# Patient Record
Sex: Female | Born: 1945 | Race: White | Hispanic: No | Marital: Married | Smoking: Former smoker
Health system: Southern US, Community
[De-identification: ages and names within clinical notes are randomized; demographics above are authoritative.]

## PROBLEM LIST (undated history)

## (undated) DIAGNOSIS — M810 Age-related osteoporosis without current pathological fracture: Secondary | ICD-10-CM

## (undated) DIAGNOSIS — M1990 Unspecified osteoarthritis, unspecified site: Secondary | ICD-10-CM

## (undated) HISTORY — DX: Age-related osteoporosis without current pathological fracture: M81.0

## (undated) HISTORY — DX: Unspecified osteoarthritis, unspecified site: M19.90

## (undated) HISTORY — PX: KNEE ARTHROSCOPY: SUR90

---

## 1998-12-30 ENCOUNTER — Ambulatory Visit (HOSPITAL_COMMUNITY): Admission: RE | Admit: 1998-12-30 | Discharge: 1998-12-30 | Payer: Self-pay | Admitting: Specialist

## 1998-12-30 ENCOUNTER — Encounter: Payer: Self-pay | Admitting: Specialist

## 1999-05-07 ENCOUNTER — Encounter: Admission: RE | Admit: 1999-05-07 | Discharge: 1999-05-07 | Payer: Self-pay | Admitting: Rheumatology

## 1999-05-07 ENCOUNTER — Encounter: Payer: Self-pay | Admitting: Rheumatology

## 2001-07-07 ENCOUNTER — Encounter: Admission: RE | Admit: 2001-07-07 | Discharge: 2001-07-07 | Payer: Self-pay | Admitting: Rheumatology

## 2001-07-07 ENCOUNTER — Encounter: Payer: Self-pay | Admitting: Rheumatology

## 2003-07-10 ENCOUNTER — Encounter: Admission: RE | Admit: 2003-07-10 | Discharge: 2003-07-10 | Payer: Self-pay | Admitting: Rheumatology

## 2004-09-18 ENCOUNTER — Other Ambulatory Visit: Admission: RE | Admit: 2004-09-18 | Discharge: 2004-09-18 | Payer: Self-pay | Admitting: Internal Medicine

## 2005-07-14 ENCOUNTER — Ambulatory Visit (HOSPITAL_COMMUNITY): Admission: RE | Admit: 2005-07-14 | Discharge: 2005-07-14 | Payer: Self-pay | Admitting: Internal Medicine

## 2005-09-25 ENCOUNTER — Encounter: Admission: RE | Admit: 2005-09-25 | Discharge: 2005-09-25 | Payer: Self-pay | Admitting: Internal Medicine

## 2006-02-15 ENCOUNTER — Other Ambulatory Visit: Admission: RE | Admit: 2006-02-15 | Discharge: 2006-02-15 | Payer: Self-pay | Admitting: Internal Medicine

## 2007-09-30 ENCOUNTER — Encounter: Admission: RE | Admit: 2007-09-30 | Discharge: 2007-09-30 | Payer: Self-pay | Admitting: Internal Medicine

## 2007-10-04 ENCOUNTER — Other Ambulatory Visit: Admission: RE | Admit: 2007-10-04 | Discharge: 2007-10-04 | Payer: Self-pay | Admitting: Internal Medicine

## 2008-05-25 ENCOUNTER — Ambulatory Visit: Payer: Self-pay | Admitting: Internal Medicine

## 2008-05-30 ENCOUNTER — Encounter: Admission: RE | Admit: 2008-05-30 | Discharge: 2008-05-30 | Payer: Self-pay | Admitting: Internal Medicine

## 2009-09-13 ENCOUNTER — Ambulatory Visit: Payer: Self-pay | Admitting: Internal Medicine

## 2009-11-15 ENCOUNTER — Other Ambulatory Visit: Admission: RE | Admit: 2009-11-15 | Discharge: 2009-11-15 | Payer: Self-pay | Admitting: Internal Medicine

## 2009-11-29 ENCOUNTER — Encounter: Admission: RE | Admit: 2009-11-29 | Discharge: 2009-11-29 | Payer: Self-pay | Admitting: Internal Medicine

## 2011-09-24 ENCOUNTER — Other Ambulatory Visit: Payer: Medicare Other | Admitting: Internal Medicine

## 2011-09-24 DIAGNOSIS — Z0000 Encounter for general adult medical examination without abnormal findings: Secondary | ICD-10-CM

## 2011-09-24 LAB — CBC WITH DIFFERENTIAL/PLATELET
Basophils Relative: 0 % (ref 0–1)
Eosinophils Absolute: 0.1 10*3/uL (ref 0.0–0.7)
HCT: 42.2 % (ref 36.0–46.0)
Lymphocytes Relative: 33 % (ref 12–46)
Lymphs Abs: 2.1 10*3/uL (ref 0.7–4.0)
MCH: 27.5 pg (ref 26.0–34.0)
MCHC: 33.6 g/dL (ref 30.0–36.0)
MCV: 81.6 fL (ref 78.0–100.0)
Monocytes Relative: 7 % (ref 3–12)
Neutro Abs: 3.7 10*3/uL (ref 1.7–7.7)
RDW: 13.9 % (ref 11.5–15.5)
WBC: 6.5 10*3/uL (ref 4.0–10.5)

## 2011-09-24 LAB — LIPID PANEL
Cholesterol: 309 mg/dL — ABNORMAL HIGH (ref 0–200)
HDL: 64 mg/dL (ref >39)
Total CHOL/HDL Ratio: 4.8 Ratio

## 2011-09-24 LAB — TSH: TSH: 2.464 u[IU]/mL (ref 0.350–4.500)

## 2011-09-24 LAB — COMPREHENSIVE METABOLIC PANEL
ALT: 24 U/L (ref 0–35)
Alkaline Phosphatase: 98 U/L (ref 39–117)
BUN: 14 mg/dL (ref 6–23)
Calcium: 9.8 mg/dL (ref 8.4–10.5)
Sodium: 139 mEq/L (ref 135–145)
Total Protein: 7.5 g/dL (ref 6.0–8.3)

## 2011-09-25 ENCOUNTER — Ambulatory Visit (INDEPENDENT_AMBULATORY_CARE_PROVIDER_SITE_OTHER): Payer: Medicare Other | Admitting: Internal Medicine

## 2011-09-25 ENCOUNTER — Encounter: Payer: Self-pay | Admitting: Internal Medicine

## 2011-09-25 VITALS — BP 120/68 | HR 72 | Temp 98.1°F | Ht 68.0 in | Wt 156.0 lb

## 2011-09-25 DIAGNOSIS — Z1211 Encounter for screening for malignant neoplasm of colon: Secondary | ICD-10-CM

## 2011-09-25 DIAGNOSIS — Z0000 Encounter for general adult medical examination without abnormal findings: Secondary | ICD-10-CM

## 2011-09-25 DIAGNOSIS — M25562 Pain in left knee: Secondary | ICD-10-CM

## 2011-09-25 DIAGNOSIS — M810 Age-related osteoporosis without current pathological fracture: Secondary | ICD-10-CM

## 2011-09-25 DIAGNOSIS — Z23 Encounter for immunization: Secondary | ICD-10-CM

## 2011-09-25 DIAGNOSIS — M25569 Pain in unspecified knee: Secondary | ICD-10-CM

## 2011-09-25 DIAGNOSIS — E785 Hyperlipidemia, unspecified: Secondary | ICD-10-CM

## 2011-09-25 LAB — POCT URINALYSIS DIPSTICK
Glucose, UA: NEGATIVE
Ketones, UA: NEGATIVE
Nitrite, UA: NEGATIVE
Spec Grav, UA: 1.015
Urobilinogen, UA: NEGATIVE

## 2011-09-25 NOTE — Progress Notes (Signed)
Subjective:    Patient ID: Samantha Byrd, female    DOB: Jul 23, 1945, 66 y.o.   MRN: [id number]  HPI Subjective:   Patient presents for Welcome to Medicare physical exam. History of hyperlipidemia currently refuses medication or nutritional counseling. History of osteoporosis. History of right patellar dislocation. Has history of vaginal atrophy. Is interested in Premarin vaginal cream since she is sexually active. She took Fosamax for several years beginning in 2001. Discontinued at my suggestion and 2011. Last bone density study 2007. Had sigmoidoscopy by Dr. Matthias Hughs in 1998 but has refused colonoscopy. Refuses mammogram. Did ask her to do 3 Hemoccult cards. No known drug allergies. Last mammogram on file 2011. She saw an endocrinologist in 2007 regarding what to do about her osteoporosis. He recommend changing from Fosamax to Reclast if bone density study in the future should further decline in bone density. However she has refused to do this. Has seen Dr. Baldo Ash in [place] for treatment (holistic) of knee pain. Sees him every few months and thinks that has helped her knee pain. Doesn't have knee pain at the present time.  History of right knee arthroscopic surgery for torn cartilage 1987, history of arthroscopic knee surgery left knee for torn cartilage 2002.  Patient has never been hospitalized except for childbirth times one.  Married. Has a Masters degree. Self-employed bookkeeper. Does not smoke or consume alcohol.  Review Past Medical/Family/Social:   Risk Factors  Current exercise habits:  Dietary issues discussed:   Cardiac risk factors: Obesity (BMI >= 30 kg/m2).   Depression Screen  (Note: if answer to either of the following is "Yes", a more complete depression screening is indicated)  Over the past two weeks, have you felt down, depressed or hopeless? no Over the past two weeks, have you felt little interest or pleasure in doing things? no Have you lost  interest or pleasure in daily life? no  Do you often feel hopeless? no Do you cry easily over simple problems? No   Activities of Daily Living  In your present state of health, do you have any difficulty performing the following activities?:  Driving? No  Managing money? No  Feeding yourself? No  Getting from bed to chair? No  Climbing a flight of stairs? No  Preparing food and eating?: No  Bathing or showering? No  Getting dressed: No  Getting to the toilet? No  Using the toilet:No  Moving around from place to place: No  In the past year have you fallen or had a near fall?:No  Are you sexually active? yes Do you have more than one partner? No   Hearing Difficulties: No  Do you often ask people to speak up or repeat themselves? No  Do you experience ringing or noises in your ears? no  Do you have difficulty understanding soft or whispered voices? No  Do you feel that you have a problem with memory? no Do you often misplace items? No  Do you feel safe at home? Yes   Cognitive Testing  Alert? Yes Normal Appearance?Yes  Oriented to person? Yes Place? Yes  Time? Yes  Recall of three objects? Yes  Can perform simple calculations? Yes  Displays appropriate judgment?Yes  Can read the correct time from a watch face?Yes   List the Names of Other Physician/Practitioners you currently use: Dr. Woodfin Ganja   Indicate any recent Medical Services you may have received from other than [facility] providers in the past year (date may be approximate).   Screening  Tests / Date Colonoscopy  declined                   Zostavax declined Mammogram declined Influenza Vaccine declined Tetanus/tdap 09/25/11   Objective:   Vision by Snellen chart:  Body mass index:  BP 120/68     Pulse 72 regular    Ht 5 feet 8 inches      Wt  156 pounds     BMI 24     SpO2     General appearance: Appears stated age    Head: Normocephalic, without obvious abnormality, atraumatic  Eyes: conj clear,  EOMi PEERLA  Ears: normal TM's and external ear canals both ears  Nose: Nares normal. Septum midline. Mucosa normal. No drainage or sinus tenderness.  Throat: lips, mucosa, and tongue normal; teeth and gums normal  Neck: no adenopathy, no carotid bruit, no JVD, supple, symmetrical, trachea midline and thyroid not enlarged, symmetric, no tenderness/mass/nodules  No CVA tenderness.  Lungs: clear to auscultation bilaterally  Breasts: normal appearance, no masses or tenderness, top of the pacemaker on left upper chest. Incision well-healed. It is tender.  Heart: regular rate and rhythm, S1, S2 normal, no murmur, click, rub or gallop  Abdomen: soft, non-tender; bowel sounds normal; no masses, no organomegaly  Musculoskeletal: ROM normal in all joints, no crepitus, no deformity, Normal muscle strengthen. Back  is symmetric, no curvature. Skin: Skin color, texture, turgor normal. No rashes or lesions  Lymph nodes: Cervical, supraclavicular, and axillary nodes normal.  Neurologic: CN 2 -12 Normal, Normal symmetric reflexes. Normal coordination and gait  Psych: Alert & Oriented x 3, Mood appear stable.    Assessment:    Welcome to Medicare exam  Hyperlipidemia-refuses medication  Health maintenance-refuses mammogram, colonoscopy and preventive immunizations  History of knee pain treated by Dr. Woodfin Ganja  with holistic therapy  Plan:    During the course of the visit the patient was educated and counseled about appropriate screening and preventive services including:  Screening mammography  Colorectal cancer screening  Shingles vaccine. Prescription given to that she can get the vaccine at the pharmacy or Medicare part D.  Screen + for depression. Negative.   Diet review for nutrition referral? Refused by patient Yes ____ Not Indicated __x__  Patient Instructions (the written plan) was given to the patient.  Medicare Attestation  I have personally reviewed:  The patient's medical and  social history  Their use of alcohol, tobacco or illicit drugs  Their current medications and supplements  The patient's functional ability including ADLs,fall risks, home safety risks, cognitive, and hearing and visual impairment  Diet and physical activities  Evidence for depression or mood disorders  The patient's weight, height, BMI, and visual acuity have been recorded in the chart. I have made referrals, counseling, and provided education to the patient based on review of the above and I have provided the patient with a written personalized care plan for preventive services.         Review of Systems     Objective:   Physical Exam        Assessment & Plan:

## 2011-09-27 ENCOUNTER — Encounter: Payer: Self-pay | Admitting: Internal Medicine

## 2011-09-27 DIAGNOSIS — M810 Age-related osteoporosis without current pathological fracture: Secondary | ICD-10-CM | POA: Insufficient documentation

## 2011-09-27 DIAGNOSIS — E785 Hyperlipidemia, unspecified: Secondary | ICD-10-CM | POA: Insufficient documentation

## 2011-09-27 NOTE — Patient Instructions (Addendum)
Patient refuses lipid-lowering medication. Return in one year or as needed. Refuses colonoscopy and other preventative immunizations

## 2011-10-12 ENCOUNTER — Telehealth: Payer: Self-pay

## 2011-10-12 DIAGNOSIS — Z1211 Encounter for screening for malignant neoplasm of colon: Secondary | ICD-10-CM

## 2011-10-12 NOTE — Addendum Note (Signed)
Addended by: Judy Pimple on: 10/12/2011 04:59 PM   Modules accepted: Orders

## 2011-10-13 NOTE — Telephone Encounter (Signed)
Opened in error

## 2012-04-08 NOTE — Addendum Note (Signed)
Addended by: Judy Pimple on: 04/08/2012 12:37 PM   Modules accepted: Orders

## 2013-12-14 ENCOUNTER — Other Ambulatory Visit: Payer: Medicare Other | Admitting: Internal Medicine

## 2013-12-14 DIAGNOSIS — Z131 Encounter for screening for diabetes mellitus: Secondary | ICD-10-CM

## 2013-12-14 DIAGNOSIS — Z13228 Encounter for screening for other metabolic disorders: Secondary | ICD-10-CM

## 2013-12-14 DIAGNOSIS — M6749 Ganglion, multiple sites: Secondary | ICD-10-CM

## 2013-12-14 DIAGNOSIS — Z130 Encounter for screening for diseases of the blood and blood-forming organs and certain disorders involving the immune mechanism: Secondary | ICD-10-CM

## 2013-12-14 DIAGNOSIS — M810 Age-related osteoporosis without current pathological fracture: Secondary | ICD-10-CM

## 2013-12-14 DIAGNOSIS — Z1329 Encounter for screening for other suspected endocrine disorder: Secondary | ICD-10-CM

## 2013-12-14 DIAGNOSIS — Z13220 Encounter for screening for lipoid disorders: Secondary | ICD-10-CM

## 2013-12-14 LAB — CBC WITH DIFFERENTIAL/PLATELET
BASOS ABS: 0 10*3/uL (ref 0.0–0.1)
BASOS PCT: 0 % (ref 0–1)
EOS PCT: 3 % (ref 0–5)
Eosinophils Absolute: 0.2 10*3/uL (ref 0.0–0.7)
HCT: 40.9 % (ref 36.0–46.0)
Hemoglobin: 14 g/dL (ref 12.0–15.0)
Lymphocytes Relative: 38 % (ref 12–46)
Lymphs Abs: 2 10*3/uL (ref 0.7–4.0)
MCH: 28.7 pg (ref 26.0–34.0)
MCHC: 34.2 g/dL (ref 30.0–36.0)
MCV: 84 fL (ref 78.0–100.0)
Monocytes Absolute: 0.4 10*3/uL (ref 0.1–1.0)
Monocytes Relative: 8 % (ref 3–12)
NEUTROS ABS: 2.7 10*3/uL (ref 1.7–7.7)
NEUTROS PCT: 51 % (ref 43–77)
PLATELETS: 301 10*3/uL (ref 150–400)
RBC: 4.87 MIL/uL (ref 3.87–5.11)
RDW: 13.9 % (ref 11.5–15.5)
WBC: 5.3 10*3/uL (ref 4.0–10.5)

## 2013-12-14 LAB — LIPID PANEL
Cholesterol: 267 mg/dL — ABNORMAL HIGH (ref 0–200)
HDL: 66 mg/dL (ref >39)
LDL Cholesterol: 182 mg/dL — ABNORMAL HIGH (ref 0–99)
TRIGLYCERIDES: 96 mg/dL (ref <150)
Total CHOL/HDL Ratio: 4 Ratio
VLDL: 19 mg/dL (ref 0–40)

## 2013-12-14 LAB — COMPREHENSIVE METABOLIC PANEL
ALBUMIN: 4.5 g/dL (ref 3.5–5.2)
ALT: 20 U/L (ref 0–35)
AST: 25 U/L (ref 0–37)
Alkaline Phosphatase: 103 U/L (ref 39–117)
BILIRUBIN TOTAL: 0.6 mg/dL (ref 0.2–1.2)
BUN: 17 mg/dL (ref 6–23)
CO2: 29 mEq/L (ref 19–32)
CREATININE: 0.77 mg/dL (ref 0.50–1.10)
Calcium: 9.8 mg/dL (ref 8.4–10.5)
Chloride: 100 mEq/L (ref 96–112)
GLUCOSE: 84 mg/dL (ref 70–99)
Potassium: 4.4 mEq/L (ref 3.5–5.3)
SODIUM: 139 meq/L (ref 135–145)
TOTAL PROTEIN: 7.3 g/dL (ref 6.0–8.3)

## 2013-12-14 LAB — TSH: TSH: 2.883 u[IU]/mL (ref 0.350–4.500)

## 2013-12-15 ENCOUNTER — Other Ambulatory Visit (HOSPITAL_COMMUNITY)
Admission: RE | Admit: 2013-12-15 | Discharge: 2013-12-15 | Disposition: A | Discharge status: Home / Self Care | Payer: Medicare Other | Source: Ambulatory Visit | Attending: Internal Medicine | Admitting: Internal Medicine

## 2013-12-15 ENCOUNTER — Ambulatory Visit (INDEPENDENT_AMBULATORY_CARE_PROVIDER_SITE_OTHER): Payer: Medicare Other | Admitting: Internal Medicine

## 2013-12-15 ENCOUNTER — Encounter: Payer: Self-pay | Admitting: Internal Medicine

## 2013-12-15 VITALS — BP 118/72 | HR 62 | Ht 67.25 in | Wt 138.0 lb

## 2013-12-15 DIAGNOSIS — M810 Age-related osteoporosis without current pathological fracture: Secondary | ICD-10-CM

## 2013-12-15 DIAGNOSIS — E785 Hyperlipidemia, unspecified: Secondary | ICD-10-CM

## 2013-12-15 DIAGNOSIS — Z124 Encounter for screening for malignant neoplasm of cervix: Secondary | ICD-10-CM | POA: Insufficient documentation

## 2013-12-15 DIAGNOSIS — Z0000 Encounter for general adult medical examination without abnormal findings: Secondary | ICD-10-CM

## 2013-12-15 DIAGNOSIS — M170 Bilateral primary osteoarthritis of knee: Secondary | ICD-10-CM

## 2013-12-15 DIAGNOSIS — Z780 Asymptomatic menopausal state: Secondary | ICD-10-CM

## 2013-12-15 DIAGNOSIS — M1710 Unilateral primary osteoarthritis, unspecified knee: Secondary | ICD-10-CM

## 2013-12-15 LAB — POCT URINALYSIS DIPSTICK
Bilirubin, UA: NEGATIVE
Blood, UA: NEGATIVE
Glucose, UA: NEGATIVE
Ketones, UA: NEGATIVE
Leukocytes, UA: NEGATIVE
Nitrite, UA: NEGATIVE
Protein, UA: NEGATIVE
Spec Grav, UA: 1.015
Urobilinogen, UA: NEGATIVE
pH, UA: 6.5

## 2013-12-15 LAB — VITAMIN D 25 HYDROXY (VIT D DEFICIENCY, FRACTURES): Vit D, 25-Hydroxy: 67 ng/mL (ref 30–89)

## 2013-12-15 MED ORDER — ESTROGENS, CONJUGATED 0.625 MG/GM VA CREA: 1.0000 | TOPICAL_CREAM | VAGINAL | Status: DC

## 2013-12-15 NOTE — Progress Notes (Signed)
Subjective:    Patient ID: Samantha Byrd, female    DOB: January 20, 1946, 68 y.o.   MRN: [id number]  HPI 68 year old White Female in today for health maintenance exam and evaluation of medical issues including osteoarthritis of both knees and hyperlipidemia which is diet controlled. Refuses statin therapy. Refuses pneumonia vaccine and flu vaccine. Refuses Zostavax vaccine.  History of osteoporosis, right patellar dislocation, vaginal atrophy. She took Fosamax for several years beginning in 2001 and discontinued in 2011. Last bone density study 2007. Had sigmoidoscopy by Dr. Si Gaul knee in 1998 but has refused colonoscopy. Refuses mammogram. Did ask her to return in 3 Hemoccult cards.  No known drug allergies  Last mammogram on file 2011.  She saw an endocrinologist in 2007 regarding treatment of osteoporosis. He recommended changing from Fosamax to Reclast if bone density study in the future should decline further. However she has not wanted to consider this. She has seen Dr.Blievernicht  for holistic treatment of knee pain. She recently found out he is retiring. History of right knee arthroscopic surgery for torn cartilage 1987, history of arthroscopic knee surgery left knee for torn cartilage 2002.  Patient has never been hospitalized except for childbirth wants.  Married. 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 Does not smoke or consume alcohol.  Family history: Reviewed    Review of Systems  Constitutional: Negative.   Eyes: Negative.   Cardiovascular: Negative.   Gastrointestinal: Negative.   Genitourinary:       Complaint of vaginal dryness and some times vaginal odor.  Musculoskeletal:       Bilateral knee pain seen by Dr. Janan Halter in [place]  Skin: Negative.   Hematological: Negative.   Psychiatric/Behavioral: Negative.        Objective:   Physical Exam  Vitals reviewed. Constitutional: She is oriented to person, place, and time. She appears  well-developed and well-nourished. No distress.  HENT:  Head: Normocephalic and atraumatic.  Right Ear: External ear normal.  Left Ear: External ear normal.  Mouth/Throat: Oropharynx is clear and moist. No oropharyngeal exudate.  Eyes: Conjunctivae and EOM are normal. Pupils are equal, round, and reactive to light. Left eye exhibits no discharge. No scleral icterus.  Neck: Neck supple. No JVD present. No thyromegaly present.  Cardiovascular: Normal rate, regular rhythm, normal heart sounds and intact distal pulses.   No murmur heard. Pulmonary/Chest: Breath sounds normal. No respiratory distress. She has no wheezes. She has no rales.  Breasts normal female  Abdominal: Soft. Bowel sounds are normal. She exhibits no distension and no mass. There is no tenderness. There is no rebound and no guarding.  Genitourinary:  Pap taken. Bimanual normal.  Musculoskeletal: Normal range of motion. She exhibits no edema.  Lymphadenopathy:    She has no cervical adenopathy.  Neurological: She is alert and oriented to person, place, and time. She has normal reflexes. She displays normal reflexes. No cranial nerve deficit. Coordination normal.  Skin: Skin is warm and dry. No rash noted. She is not diaphoretic.  Psychiatric: She has a normal mood and affect. Her behavior is normal. Judgment and thought content normal.          Assessment & Plan:  Osteoporosis-not interested in therapy  Vaginal atrophy  History of knee pain  Health maintenance declines colonoscopy and mammography  Hyperlipidemia. Total cholesterol 267. LDL cholesterol 182. Refuses statin therapy.  Return in one year or as needed.  Subjective:   Patient presents for Medicare Annual/Subsequent preventive examination.   Review  Past Medical/Family/Social:   Risk Factors  Current exercise habits:  Dietary issues discussed:   Cardiac risk factors-hyperlipidemia  Depression Screen -not depressed  Over the past two weeks,  have you felt down, depressed or hopeless? no Over the past two weeks, have you felt little interest or pleasure in doing things? no Do you often feel hopeless? no Do you cry easily over simple problems? No   Activities of Daily Living  In your present state of health, do you have any difficulty performing the following activities?:  Driving? No  Managing money? No  Feeding yourself? No  Getting from bed to chair? No  Climbing a flight of stairs? No  Preparing food and eating?: No  Bathing or showering? No  Getting dressed: No  Getting to the toilet? No  Using the toilet:No  Moving around from place to place: No  In the past year have you fallen or had a near fall?:No  Are you sexually active? Yes Do you have more than one partner? No   Hearing Difficulties: No  Do you often ask people to speak up or repeat themselves? No  Do you experience ringing or noises in your ears? Yes  Do you have difficulty understanding soft or whispered voices? No  Do you feel that you have a problem with memory? Yes  Do you often misplace items? No  Do you feel safe at home? Yes   Cognitive Testing  Alert? Yes Normal Appearance?Yes  Oriented to person? Yes Place? Yes  Time? Yes  Recall of three objects? Yes  Can perform simple calculations? Yes  Displays appropriate judgment?Yes  Can read the correct time from a watch face?Yes   List the Names of Other Physician/Practitioners you currently use:  Dr. Janan Halter  Indicate any recent Medical Services you may have received from other than [facility] providers in the past year (date may be approximate).   Screening Tests / Date Colonoscopy  refused                    Mammogram refused   Objective:       General appearance: Appears stated age  Head: Normocephalic, without obvious abnormality, atraumatic  Eyes: conj clear, EOMi PEERLA  Ears: normal TM's and external ear canals both ears  Nose: Nares normal. Septum midline. Mucosa normal. No  drainage or sinus tenderness.  Throat: lips, mucosa, and tongue normal; teeth and gums normal  Neck: no adenopathy, no carotid bruit, no JVD, supple, symmetrical, trachea midline and thyroid not enlarged, symmetric, no tenderness/mass/nodules  No CVA tenderness.  Lungs: clear to auscultation bilaterally  Breasts: normal appearance, no masses or tendernessHeart: regular rate and rhythm, S1, S2 normal, no murmur, click, rub or gallop  Abdomen: soft, non-tender; bowel sounds normal; no masses, no organomegaly  Musculoskeletal: ROM normal in all joints, no crepitus, no deformity, Normal muscle strengthen. Back  is symmetric, no curvature. Skin: Skin color, texture, turgor normal. No rashes or lesions  Lymph nodes: Cervical, supraclavicular, and axillary nodes normal.  Neurologic: CN 2 -12 Normal, Normal symmetric reflexes. Normal coordination and gait  Psych: Alert & Oriented x 3, Mood appear stable.    Assessment:    Annual wellness medicare exam   Plan:    During the course of the visit the patient was educated and counseled about appropriate screening and preventive services including:  Screening mammography and colonoscopy refused   Medicare Attestation  I have personally reviewed:  The patient's medical and social history  Their use of alcohol, tobacco or illicit drugs  Their current medications and supplements  The patient's functional ability including ADLs,fall risks, home safety risks, cognitive, and hearing and visual impairment  Diet and physical activities  Evidence for depression or mood disorders  The patient's weight, height, BMI, and visual acuity have been recorded in the chart. I have made referrals, counseling, and provided education to the patient based on review of the above and I have provided the patient with a written personalized care plan for preventive services.    !

## 2013-12-15 NOTE — Patient Instructions (Signed)
Patient declines flu vaccine shingles vaccine and pneumonia vaccine. Is to have bone density study and mammogram. Return in one year or as needed.

## 2013-12-19 LAB — CYTOLOGY - PAP

## 2014-01-05 ENCOUNTER — Ambulatory Visit
Admission: RE | Admit: 2014-01-05 | Discharge: 2014-01-05 | Disposition: A | Discharge status: Home / Self Care | Payer: Medicare Other | Source: Ambulatory Visit | Attending: Internal Medicine | Admitting: Internal Medicine

## 2014-01-05 ENCOUNTER — Telehealth: Payer: Self-pay

## 2014-01-05 DIAGNOSIS — Z0000 Encounter for general adult medical examination without abnormal findings: Secondary | ICD-10-CM

## 2014-01-05 DIAGNOSIS — Z780 Asymptomatic menopausal state: Secondary | ICD-10-CM

## 2014-01-05 NOTE — Telephone Encounter (Signed)
Message copied by Amado Coe on Fri Jan 05, 2014  2:23 PM ------      Message from: Elby Showers      Created: Fri Jan 05, 2014  2:08 PM       Bone density has worsened since last study. Is she willing to try Fosamax weekly? ------

## 2014-01-05 NOTE — Telephone Encounter (Signed)
Patient informed of bone density results.  Asked her if she was willing to start fosamax and she declined.

## 2014-03-07 DIAGNOSIS — R739 Hyperglycemia, unspecified: Secondary | ICD-10-CM | POA: Insufficient documentation

## 2014-03-07 DIAGNOSIS — R252 Cramp and spasm: Secondary | ICD-10-CM | POA: Insufficient documentation

## 2014-03-07 DIAGNOSIS — D72829 Elevated white blood cell count, unspecified: Secondary | ICD-10-CM | POA: Insufficient documentation

## 2014-03-07 HISTORY — PX: FRACTURE SURGERY: SHX138

## 2014-03-16 ENCOUNTER — Encounter: Payer: Self-pay | Admitting: *Deleted

## 2014-03-16 ENCOUNTER — Non-Acute Institutional Stay (SKILLED_NURSING_FACILITY): Payer: Medicare Other | Admitting: Internal Medicine

## 2014-03-16 DIAGNOSIS — S72142S Displaced intertrochanteric fracture of left femur, sequela: Secondary | ICD-10-CM

## 2014-03-16 DIAGNOSIS — M1710 Unilateral primary osteoarthritis, unspecified knee: Secondary | ICD-10-CM

## 2014-03-16 DIAGNOSIS — M179 Osteoarthritis of knee, unspecified: Secondary | ICD-10-CM

## 2014-03-16 DIAGNOSIS — K5900 Constipation, unspecified: Secondary | ICD-10-CM

## 2014-03-16 DIAGNOSIS — R252 Cramp and spasm: Secondary | ICD-10-CM

## 2014-03-16 DIAGNOSIS — IMO0002 Reserved for concepts with insufficient information to code with codable children: Secondary | ICD-10-CM

## 2014-03-16 DIAGNOSIS — D62 Acute posthemorrhagic anemia: Secondary | ICD-10-CM

## 2014-03-18 NOTE — Progress Notes (Signed)
Patient ID: Samantha Byrd, female   DOB: [birth year], 67 y.o.   MRN: [id number]    [facility]  Chief Complaint  Patient presents with  . New Admit To SNF   Allergies  Allergen Reactions  . Iohexol      Code: HIVES, Desc: hives over face,chest,arms & legs, pt refused benadryl(no driver or other responsible person),recommend 13 hr prep in future per dr.[email or web address]//[email or web address], Onset Date: [id number]    HPI 68 y/o female pt is here for STR post hospital admission with left hip fracture. She underwent left hip arthroplasty. Her pain is under control with current regimen. Denies muscle spasm. Has loose stool and would like her stool softeners changed to prn from scheduled. No abdominal pain, nausea or vomiting. She is working with therapy team.  Review of Systems  Constitutional: Negative for fever, chills, malaise/fatigue and diaphoresis.  HENT: Negative for congestion, hearing loss and sore throat.   Eyes: Negative for blurred vision, double vision and discharge.  Respiratory: Negative for cough, sputum production, shortness of breath and wheezing.   Cardiovascular: Negative for chest pain, palpitations, orthopnea and leg swelling.  Gastrointestinal: Negative for heartburn, nausea, vomiting, abdominal pain.  Genitourinary: Negative for dysuria, urgency, frequency and flank pain.  Musculoskeletal: Negative for back pain, falls  Skin: Negative for itching and rash.  Neurological: Negative for dizziness, tingling, focal weakness and headaches.  Psychiatric/Behavioral: Negative for depression and memory loss. The patient is not nervous/anxious.  Past Medical History  Diagnosis Date  . Osteoporosis   . Arthritis    Past Surgical History  Procedure Laterality Date  . Fracture surgery Left 03/07/2014  . Knee arthroscopy Bilateral    Family History  Problem Relation Age of Onset  . Osteoarthritis Father    History   Social History  . Marital Status:  Married    Spouse Name: N/A    Number of Children: N/A  . Years of Education: N/A   Occupational History  . Not on file.   Social History Main Topics  . Smoking status: Former Smoker    Types: Cigarettes    Quit date: 05/27/1964  . Smokeless tobacco: Never Used  . Alcohol Use: Yes     Comment: occasional  . Drug Use: No  . Sexual Activity: Not on file   Other Topics Concern  . Not on file   Social History Narrative   Medication reviewed. See [facility]   Medication List       This list is accurate as of: 03/16/14 11:59 PM.  Always use your most recent med list.               acetaminophen 650 MG CR tablet  Commonly known as:  TYLENOL  Take 650 mg by mouth every 6 (six) hours as needed for pain.     antioxidant formula SG capsule  Take 1 capsule by mouth daily.     aspirin 325 MG tablet  Take 325 mg by mouth 2 (two) times daily.     bisacodyl 5 MG EC tablet  Commonly known as:  DULCOLAX  Take 5 mg by mouth daily as needed for moderate constipation.     bisacodyl 10 MG suppository  Commonly known as:  DULCOLAX  Place 10 mg rectally daily as needed for moderate constipation.     calcium carbonate 600 MG Tabs tablet  Commonly known as:  OS-CAL  Take 600 mg by mouth 2 (two) times daily with a meal.  Cholecalciferol 1000 UNITS capsule  Take 1,000 Units by mouth daily.     conjugated estrogens vaginal cream  Commonly known as:  PREMARIN  Place 1 Applicatorful vaginally every 3 (three) days.     glucosamine-chondroitin 500-400 MG tablet  Take by mouth 3 (three) times daily.     HYDROcodone-acetaminophen 5-325 MG per tablet  Commonly known as:  NORCO/VICODIN  Take 1-2 tablets by mouth every 4 (four) hours as needed for moderate pain.     magnesium gluconate 30 MG tablet  Commonly known as:  MAGONATE  Take 30 mg by mouth daily.     senna-docusate 8.6-50 MG per tablet  Commonly known as:  Senokot-S  Take 1 tablet by mouth 2 (two) times daily. For  constipation     traMADol 50 MG tablet  Commonly known as:  ULTRAM  Take 50 mg by mouth every 4 (four) hours as needed for moderate pain.        Physical exam BP 113/66 mmHg  Pulse 88  Temp(Src) 98.1 F (36.7 C)  Resp 16  SpO2 99%  General- elderly female in no acute distress Head- atraumatic, normocephalic Eyes- PERRLA, EOMI, no pallor, no icterus, no discharge Neck- no lymphadenopathy Cardiovascular- normal s1,s2, no murmurs, normal distal pulses Respiratory- bilateral clear to auscultation, no wheeze, no rhonchi, no crackles Abdomen- bowel sounds present, soft, non tender Musculoskeletal- able to move all 4 extremities, left leg ROM limited at the hip area, using walker  Neurological- no focal deficit Skin- warm and dry, dressing clean and dry, incision site glued and healing well Psychiatry- alert and oriented to person, place and time, normal mood and affect  Labs reviewed. See discharge paperwork for details/ values  Assessment/plan  Left hip fracture S/p left hip arthroplasty. Will have her work with physical therapy and occupational therapy team to help with gait training and muscle strengthening exercises.fall precautions. Skin care. Encourage to be out of bed. Has f/u with orthopedics. WBAT. Continue skin/ dressing care. Continue prn norco and tramadol for pain   OA Stable, continue oscal, vit d supplement and glucosamine-chondroitin supplement  Constipation Has semi formed stools and increased frequency. D/c dulcolax suppository and senna s for now and change her bisacodyl 5 mg to qhs prn only. Encouraged hydration  Anemia Post op blood loss could be contributing to this, check cbc next week  Leg cramps Stable on magnesium supplement and home remedy of vinegar in water helps her. Continue this

## 2014-03-22 ENCOUNTER — Non-Acute Institutional Stay (SKILLED_NURSING_FACILITY): Payer: Medicare Other | Admitting: Adult Health

## 2014-03-22 ENCOUNTER — Encounter: Payer: Self-pay | Admitting: Adult Health

## 2014-03-22 DIAGNOSIS — M810 Age-related osteoporosis without current pathological fracture: Secondary | ICD-10-CM

## 2014-03-22 DIAGNOSIS — D62 Acute posthemorrhagic anemia: Secondary | ICD-10-CM

## 2014-03-22 DIAGNOSIS — K5900 Constipation, unspecified: Secondary | ICD-10-CM

## 2014-03-22 DIAGNOSIS — S72142S Displaced intertrochanteric fracture of left femur, sequela: Secondary | ICD-10-CM

## 2014-03-22 NOTE — Progress Notes (Signed)
Patient ID: Samantha Byrd, female   DOB: [birth year], 68 y.o.   MRN: [id number]   03/22/2014  Facility:  Nursing Home Location:  [place] Room Number: 908-P LEVEL OF CARE:  SNF (31)   Chief Complaint  Patient presents with  . Discharge Note    Left hip fracture status post left hip arthroplasty, osteoporosis, constipation and anemia    HISTORY OF PRESENT ILLNESS:  This is a 68 year old female who is for discharge home with home health PT and OT. DME: Rolling walker and bedside commode. She has been admitted to [facility] on 03/13/14 from [facility] with left hip fracture status post left hip arthroplasty. Patient was admitted to this facility for short-term rehabilitation after the patient's recent hospitalization.  Patient has completed SNF rehabilitation and therapy has cleared the patient for discharge.  REASSESSMENT OF ONGOING PROBLEMS:  ANEMIA: The anemia has been stable. The patient denies fatigue, melena or hematochezia.  11/15  hgb 9.7  CONSTIPATION: The constipation remains stable. No complications from the medications presently being used. Patient denies ongoing constipation, abdominal pain, nausea or vomiting.  OSTEOPOROSIS: The patient's osteoporosis remains stable. The patient denies recent worsening of pain and the range of motion remains stable. There has not been any evidence of fractures. No complications noted from the medications presently being used.  PAST MEDICAL HISTORY:  Past Medical History  Diagnosis Date  . Osteoporosis   . Arthritis     CURRENT MEDICATIONS: Reviewed per MAR/see medication list  Allergies  Allergen Reactions  . Iohexol      Code: HIVES, Desc: hives over face,chest,arms & legs, pt refused benadryl(no driver or other responsible person),recommend 13 hr prep in future per dr.[email or web address]//[email or web address], Onset Date: [id number]      REVIEW OF SYSTEMS:  GENERAL: no change in appetite, no fatigue,  no weight changes, no fever, chills or weakness RESPIRATORY: no cough, SOB, DOE, wheezing, hemoptysis CARDIAC: no chest pain, edema or palpitations GI: no abdominal pain, diarrhea, constipation, heart burn, nausea or vomiting  PHYSICAL EXAMINATION  GENERAL: no acute distress, normal body habitus EYES: conjunctivae normal, sclerae normal, normal eye lids NECK: supple, trachea midline, no neck masses, no thyroid tenderness, no thyromegaly LYMPHATICS: no LAN in the neck, no supraclavicular LAN RESPIRATORY: breathing is even & unlabored, BS CTAB CARDIAC: RRR, no murmur,no extra heart sounds, no edema GI: abdomen soft, normal BS, no masses, no tenderness, no hepatomegaly, no splenomegaly EXTREMITIES: Able to move all 4 extremities; walks with walker PSYCHIATRIC: the patient is alert & oriented to person, affect & behavior appropriate  LABS/RADIOLOGY: 03/12/14  WBC 6.2 hemoglobin 9.7 hematocrit 29.1 MCV 85 03/10/14  sodium 137 potassium 3.8 glucose 97 BUN 9 creatinine 0.50 calcium 8.2  ASSESSMENT/PLAN:  Left hip fracture status post left hip arthroplasty - for home health PT and OT Osteoporosis - continue Os-Cal, vitamin D and glucosamine chondroitin Constipation - stable; continue bisacodyl 5 mg 1 tab by mouth daily when necessary Anemia, acute blood loss - stable; hemoglobin 9.7   Patient will receive home health PT and OT.  DME provided: Rolling walker and bedside commode  Total discharge time: Greater than 30 minutes  Discharge time involved coordination of the discharge process with Education officer, museum, nursing staff and therapy department. Medical justification for home health services/DME verified.    CPT CODE: 26712    MEDINA-VARGAS,Marysue Fait, [facility] [phone]

## 2014-03-25 DIAGNOSIS — S72002D Fracture of unspecified part of neck of left femur, subsequent encounter for closed fracture with routine healing: Secondary | ICD-10-CM

## 2014-03-25 DIAGNOSIS — M810 Age-related osteoporosis without current pathological fracture: Secondary | ICD-10-CM

## 2014-03-25 DIAGNOSIS — Z4889 Encounter for other specified surgical aftercare: Secondary | ICD-10-CM

## 2014-03-25 DIAGNOSIS — M1990 Unspecified osteoarthritis, unspecified site: Secondary | ICD-10-CM

## 2014-04-09 ENCOUNTER — Telehealth: Payer: Self-pay | Admitting: Internal Medicine

## 2014-04-10 NOTE — Telephone Encounter (Signed)
Spoke with patient to advise that we will need her hospital records for when she had the hip pin placement in [place].  Patient advised she believes they have all of the records.  Advised we'll need labs, opt report, discharge summary, etc.  Patient will check with husband to see if they have records.  If not, she will then get records.  Patient is going on new insurance 04/13/14 [facility]) and she will need an order for in home PT if it should be continued.  Dr. Renold Genta will need to review records prior to writing any new orders.  Patient hasn't been seen here in our office for this hip fracture.  Hip fracture happened in [place] while visiting family in November.

## 2014-04-11 ENCOUNTER — Telehealth: Payer: Self-pay | Admitting: Internal Medicine

## 2014-04-11 ENCOUNTER — Telehealth: Payer: Self-pay | Admitting: *Deleted

## 2014-04-11 DIAGNOSIS — S72142G Displaced intertrochanteric fracture of left femur, subsequent encounter for closed fracture with delayed healing: Secondary | ICD-10-CM

## 2014-04-11 NOTE — Telephone Encounter (Signed)
Spoke with patient about left intertrochanteric femoral fracture which occurred on 03/07/2014 in [place]. She was visiting there. She was admitted to the [facility]. She was discharged March 13, 2014 . She was then in rehabilitation at [facility] until 03/22/2014. She is now at home receiving home physical therapy through [facility]. Patient says needs to have physical therapy evaluation as an outpatient. Says she's now able to ambulate with a cane. Home care will no longer be available. Says insurance will cover [facility] and we will have to contact them regarding an appointment. She does have follow-up appointment with orthopedist in [facility] 06/01/2013. She plans on keeping that appointment. White blood cell count at the time of admission was 14,500 with hemoglobin 12.2 g.  Left Femoral fracture was comminuted. It was mildly displaced. Orthopedist in [place] is Dr. Clint Bolder at [facility]. She did require 1 unit packed red blood cells transfusion on postoperative day 2 for acute postop blood loss anemia. She was discharged home on aspirin 325 mg twice daily. She had an open reduction internal fixation of the left femur.

## 2014-04-11 NOTE — Telephone Encounter (Signed)
Faxed referral for patient PT to [facility]

## 2014-04-17 ENCOUNTER — Telehealth: Payer: Self-pay | Admitting: Internal Medicine

## 2014-04-17 ENCOUNTER — Telehealth: Payer: Self-pay | Admitting: *Deleted

## 2014-04-17 ENCOUNTER — Ambulatory Visit (INDEPENDENT_AMBULATORY_CARE_PROVIDER_SITE_OTHER): Payer: Commercial Managed Care - HMO | Admitting: Internal Medicine

## 2014-04-17 ENCOUNTER — Encounter: Payer: Self-pay | Admitting: Internal Medicine

## 2014-04-17 VITALS — BP 116/74 | HR 81 | Temp 97.8°F | Wt 143.0 lb

## 2014-04-17 DIAGNOSIS — R002 Palpitations: Secondary | ICD-10-CM

## 2014-04-17 DIAGNOSIS — M25569 Pain in unspecified knee: Secondary | ICD-10-CM

## 2014-04-17 DIAGNOSIS — Z130 Encounter for screening for diseases of the blood and blood-forming organs and certain disorders involving the immune mechanism: Secondary | ICD-10-CM | POA: Diagnosis not present

## 2014-04-17 DIAGNOSIS — I491 Atrial premature depolarization: Secondary | ICD-10-CM

## 2014-04-17 DIAGNOSIS — S7292XS Unspecified fracture of left femur, sequela: Secondary | ICD-10-CM | POA: Diagnosis not present

## 2014-04-17 DIAGNOSIS — S7292XA Unspecified fracture of left femur, initial encounter for closed fracture: Secondary | ICD-10-CM | POA: Insufficient documentation

## 2014-04-17 DIAGNOSIS — Z0000 Encounter for general adult medical examination without abnormal findings: Secondary | ICD-10-CM | POA: Diagnosis not present

## 2014-04-17 NOTE — Patient Instructions (Signed)
To have Dopplers of the lower extremities tomorrow. Lab work is pending. EKG shows only rare PACs

## 2014-04-17 NOTE — Telephone Encounter (Signed)
Left message for patient on her answering machine appt for Venous Dopplar is 04/18/2014 at 1:30 PM at [facility] pt to report to admitting for check in

## 2014-04-17 NOTE — Telephone Encounter (Signed)
[facility] PT called stating patient had irregular heartbeat which seemed to be a new issue.  After reviewing patient's last CPE, patient had regular HR, rhythm, no history.  Today, she is asymptomatic, her BP is 118/64, pulse 74-94, she is stable.  Spoke with Dr. Renold Genta who advised she will see patient this afternoon to assess.  Patient added to schedule today at 3:30.

## 2014-04-17 NOTE — Progress Notes (Signed)
   Subjective:    Patient ID: Samantha Byrd, female    DOB: 1945-07-16, 69 y.o.   MRN: [id number]  HPI Received call today from [facility] personnel regarding irregular pulse. Office visit was advised. Patient is on aspirin as DVT prophylaxis per orthopedist for fractured femur. She is receiving physical therapy. Patient says [facility] personnel was concerned about a possible DVT. Her vital signs were normal but they noticed an irregular pulse but rate was controlled.  She has appointment to see orthopedist in February. Feeling a little tightness in her femur which I think is probably normal. Has been referred to [facility] for orthopedics for more physical therapy. [facility] will terminate this coming Thursday. She is ambulating with a walker. Moving very well.  Says sister has had issues recently with irregular heartbeat. Patient is never had dysrhythmia before. No chest pain. No shortness of breath.    Review of Systems     Objective:   Physical Exam Irregular irregular pulse. Neck is supple no JVD thyromegaly or carotid bruits. Chest clear. Cardiac exam irregular irregular rhythm. Extremity without edema. No calf tenderness.  EKG shows rare PAC       Assessment & Plan:  Premature atrial contractions  Status post fractured femur repair now receiving physical therapy and is on aspirin therapy  Plan: CBC and electrolytes checked. Have Dopplers of both lower extremities tomorrow. Continue aspirin therapy.

## 2014-04-17 NOTE — Telephone Encounter (Signed)
Patient receiving physical therapy for femur fracture. Personnel from [employer] called stating that patient has irregular pulse. However pulse is between 72 and 94 and blood pressure is normal. She is asymptomatic. Office visit advised.

## 2014-04-18 ENCOUNTER — Telehealth: Payer: Self-pay | Admitting: *Deleted

## 2014-04-18 ENCOUNTER — Ambulatory Visit (HOSPITAL_COMMUNITY)
Admission: RE | Admit: 2014-04-18 | Discharge: 2014-04-18 | Disposition: A | Discharge status: Home / Self Care | Payer: Commercial Managed Care - HMO | Source: Ambulatory Visit | Attending: Internal Medicine | Admitting: Internal Medicine

## 2014-04-18 DIAGNOSIS — M25569 Pain in unspecified knee: Secondary | ICD-10-CM

## 2014-04-18 DIAGNOSIS — Z8781 Personal history of (healed) traumatic fracture: Secondary | ICD-10-CM | POA: Diagnosis not present

## 2014-04-18 DIAGNOSIS — R0989 Other specified symptoms and signs involving the circulatory and respiratory systems: Secondary | ICD-10-CM | POA: Diagnosis present

## 2014-04-18 LAB — CBC WITH DIFFERENTIAL/PLATELET
BASOS ABS: 0 10*3/uL (ref 0.0–0.1)
BASOS PCT: 0 % (ref 0–1)
EOS PCT: 2 % (ref 0–5)
Eosinophils Absolute: 0.2 10*3/uL (ref 0.0–0.7)
HEMATOCRIT: 39.6 % (ref 36.0–46.0)
HEMOGLOBIN: 13.2 g/dL (ref 12.0–15.0)
Lymphocytes Relative: 32 % (ref 12–46)
Lymphs Abs: 2.7 10*3/uL (ref 0.7–4.0)
MCH: 28.2 pg (ref 26.0–34.0)
MCHC: 33.3 g/dL (ref 30.0–36.0)
MCV: 84.6 fL (ref 78.0–100.0)
MONOS PCT: 7 % (ref 3–12)
MPV: 9.7 fL (ref 8.6–12.4)
Monocytes Absolute: 0.6 10*3/uL (ref 0.1–1.0)
Neutro Abs: 5 10*3/uL (ref 1.7–7.7)
Neutrophils Relative %: 59 % (ref 43–77)
PLATELETS: 266 10*3/uL (ref 150–400)
RBC: 4.68 MIL/uL (ref 3.87–5.11)
RDW: 14 % (ref 11.5–15.5)
WBC: 8.5 10*3/uL (ref 4.0–10.5)

## 2014-04-18 LAB — BASIC METABOLIC PANEL
BUN: 18 mg/dL (ref 6–23)
CO2: 27 mEq/L (ref 19–32)
CREATININE: 0.61 mg/dL (ref 0.50–1.10)
Calcium: 9.1 mg/dL (ref 8.4–10.5)
Chloride: 101 mEq/L (ref 96–112)
GLUCOSE: 103 mg/dL — AB (ref 70–99)
Potassium: 4.1 mEq/L (ref 3.5–5.3)
SODIUM: 137 meq/L (ref 135–145)

## 2014-04-18 NOTE — Telephone Encounter (Signed)
Patient Venous Duplex negative for DVT as reported by phone report

## 2014-04-18 NOTE — Telephone Encounter (Signed)
Notified patient reviewed recent lab results

## 2014-04-18 NOTE — Telephone Encounter (Signed)
Notified patient Venous Doppler normal study negative for DVT

## 2014-04-18 NOTE — Progress Notes (Signed)
*  PRELIMINARY RESULTS* Vascular Ultrasound Lower extremity venous duplex has been completed.  Preliminary findings: No evidence of DVT.   Landry Mellow, RDMS, RVT  04/18/2014, 2:29 PM

## 2015-03-19 ENCOUNTER — Telehealth: Payer: Self-pay

## 2015-03-19 DIAGNOSIS — M25551 Pain in right hip: Secondary | ICD-10-CM

## 2015-03-19 NOTE — Telephone Encounter (Signed)
Patient contacted office stating that she needs a referral to [facility] for her hip. Contact number is [phone] and she wants to see Dr. Janan Ridge.

## 2015-03-19 NOTE — Telephone Encounter (Signed)
She states that she is having pain in the right hip which is the opposite one that she fractured.

## 2015-03-19 NOTE — Telephone Encounter (Signed)
Please make referral Is she having pain or other symptoms? Need  reason

## 2015-03-20 NOTE — Telephone Encounter (Signed)
OK make referral please. I guess that means an appt date? She may change date if she does not like it.

## 2015-03-20 NOTE — Telephone Encounter (Signed)
Referral has been faxed. Their office will contact patient.

## 2015-10-25 ENCOUNTER — Encounter: Payer: Self-pay | Admitting: Internal Medicine

## 2015-10-25 ENCOUNTER — Ambulatory Visit (INDEPENDENT_AMBULATORY_CARE_PROVIDER_SITE_OTHER): Payer: Commercial Managed Care - HMO | Admitting: Internal Medicine

## 2015-10-25 VITALS — BP 128/72 | HR 84 | Temp 98.3°F | Resp 18 | Ht 67.0 in | Wt 149.0 lb

## 2015-10-25 DIAGNOSIS — J209 Acute bronchitis, unspecified: Secondary | ICD-10-CM | POA: Diagnosis not present

## 2015-10-25 MED ORDER — AZITHROMYCIN 250 MG PO TABS: ORAL_TABLET | ORAL | Status: DC

## 2015-10-25 NOTE — Patient Instructions (Signed)
Take Zithromax Z-PAK as directed. Stay out of the heat. Rest and drink plenty of fluids. Call if not better in 7-10 days.

## 2015-10-25 NOTE — Progress Notes (Signed)
   Subjective:    Patient ID: Samantha Byrd, female    DOB: 1945-06-27, 70 y.o.   MRN: [id number]  HPI 70 year old Female not seen since January 2016 in today with 2 week history of URI symptoms. Has had low-grade fever cough and congestion. Is concerned that she has not gotten a lot better since onset of symptoms at the end of June. No shaking chills. Cough is productive but she has not looked at sputum.    Review of Systems see above     Objective:   Physical Exam  Skin warm and dry. Nodes none. Pharynx is clear. Right TM is full. Left TM is clear. Neck is supple without JVD thyromegaly or adenopathy. Chest is clear to auscultation without rales or wheezing. She has a deep congested cough      Assessment & Plan:  Acute bronchitis  Plan: Zithromax Z-Pak take 2 tablets day one followed by 1 tablet days 2 through 5. She does not want cough medication at the present time.

## 2015-11-20 ENCOUNTER — Other Ambulatory Visit: Payer: Self-pay | Admitting: Internal Medicine

## 2015-11-20 DIAGNOSIS — M8580 Other specified disorders of bone density and structure, unspecified site: Secondary | ICD-10-CM

## 2015-11-20 DIAGNOSIS — E2839 Other primary ovarian failure: Secondary | ICD-10-CM

## 2016-01-07 ENCOUNTER — Other Ambulatory Visit: Payer: Commercial Managed Care - HMO

## 2016-01-08 ENCOUNTER — Ambulatory Visit
Admission: RE | Admit: 2016-01-08 | Discharge: 2016-01-08 | Disposition: A | Discharge status: Home / Self Care | Payer: Commercial Managed Care - HMO | Source: Ambulatory Visit | Attending: Internal Medicine | Admitting: Internal Medicine

## 2016-01-08 DIAGNOSIS — E2839 Other primary ovarian failure: Secondary | ICD-10-CM

## 2016-01-08 DIAGNOSIS — M8580 Other specified disorders of bone density and structure, unspecified site: Secondary | ICD-10-CM

## 2016-01-14 ENCOUNTER — Ambulatory Visit (INDEPENDENT_AMBULATORY_CARE_PROVIDER_SITE_OTHER): Payer: Commercial Managed Care - HMO | Admitting: Internal Medicine

## 2016-01-14 VITALS — BP 114/72 | HR 66 | Temp 97.4°F | Wt 150.0 lb

## 2016-01-14 DIAGNOSIS — M85851 Other specified disorders of bone density and structure, right thigh: Secondary | ICD-10-CM

## 2016-01-14 DIAGNOSIS — M8588 Other specified disorders of bone density and structure, other site: Secondary | ICD-10-CM

## 2016-02-08 ENCOUNTER — Encounter: Payer: Self-pay | Admitting: Internal Medicine

## 2016-02-08 DIAGNOSIS — M8580 Other specified disorders of bone density and structure, unspecified site: Secondary | ICD-10-CM | POA: Insufficient documentation

## 2016-02-08 NOTE — Progress Notes (Signed)
   Subjective:    Patient ID: Samantha Byrd, female    DOB: Sep 30, 1945, 70 y.o.   MRN: [id number]  HPI 70 year old Female in today to discuss recent bone density study.  Bone density study done in late September showed lumbar spine T score of -2.3. Left femur was excluded due to surgical hardware. Right femoral neck -2.1.   Patient took Fosamax for  a period off time but discontinued it approximately 2011.  In 2011 T score in the lumbar spine was -2.1 and in the left femoral neck -2.3  In 2015 T score was -2.2 in the lumbar spine and -2.9 in the left femoral neck.    Review of Systems see above     Objective:   Physical Exam Not examined. Spent 15 minutes speaking with her about these issues. She is really not interested in medication for osteopenia.       Assessment & Plan:  Osteopenia-she is not interested in bisphosphonate therapy at this time.  Plan: Continue vitamin D supplementation. Return in January for physical examination. She will continue her multivitamins and minerals as well as magnesium, glucosamine and chondroitin sulfate. She also takes, liver all.

## 2016-02-08 NOTE — Patient Instructions (Addendum)
Return in January for physical examination. Take vitamin D supplement.

## 2016-04-27 ENCOUNTER — Other Ambulatory Visit: Payer: Medicare HMO | Admitting: Internal Medicine

## 2016-05-01 ENCOUNTER — Encounter: Payer: Commercial Managed Care - HMO | Admitting: Internal Medicine

## 2016-06-15 ENCOUNTER — Other Ambulatory Visit: Payer: Medicare HMO | Admitting: Internal Medicine

## 2016-06-15 DIAGNOSIS — Z0000 Encounter for general adult medical examination without abnormal findings: Secondary | ICD-10-CM | POA: Diagnosis not present

## 2016-06-15 DIAGNOSIS — Z1321 Encounter for screening for nutritional disorder: Secondary | ICD-10-CM | POA: Diagnosis not present

## 2016-06-15 DIAGNOSIS — E785 Hyperlipidemia, unspecified: Secondary | ICD-10-CM

## 2016-06-15 DIAGNOSIS — Z1329 Encounter for screening for other suspected endocrine disorder: Secondary | ICD-10-CM | POA: Diagnosis not present

## 2016-06-15 DIAGNOSIS — M810 Age-related osteoporosis without current pathological fracture: Secondary | ICD-10-CM | POA: Diagnosis not present

## 2016-06-15 DIAGNOSIS — M8580 Other specified disorders of bone density and structure, unspecified site: Secondary | ICD-10-CM | POA: Diagnosis not present

## 2016-06-15 LAB — COMPREHENSIVE METABOLIC PANEL
ALK PHOS: 96 U/L (ref 33–130)
ALT: 16 U/L (ref 6–29)
AST: 23 U/L (ref 10–35)
Albumin: 4.6 g/dL (ref 3.6–5.1)
BUN: 20 mg/dL (ref 7–25)
CALCIUM: 10.1 mg/dL (ref 8.6–10.4)
CO2: 28 mmol/L (ref 20–31)
Chloride: 100 mmol/L (ref 98–110)
Creat: 0.83 mg/dL (ref 0.60–0.93)
GLUCOSE: 80 mg/dL (ref 65–99)
POTASSIUM: 4.5 mmol/L (ref 3.5–5.3)
Sodium: 139 mmol/L (ref 135–146)
Total Bilirubin: 0.6 mg/dL (ref 0.2–1.2)
Total Protein: 7.6 g/dL (ref 6.1–8.1)

## 2016-06-15 LAB — LIPID PANEL
CHOL/HDL RATIO: 4.5 ratio (ref <5.0)
Cholesterol: 312 mg/dL — ABNORMAL HIGH (ref <200)
HDL: 70 mg/dL (ref >50)
LDL CALC: 218 mg/dL — AB (ref <100)
Triglycerides: 118 mg/dL (ref <150)
VLDL: 24 mg/dL (ref <30)

## 2016-06-15 LAB — CBC WITH DIFFERENTIAL/PLATELET
Basophils Absolute: 62 cells/uL (ref 0–200)
Basophils Relative: 1 %
EOS ABS: 124 {cells}/uL (ref 15–500)
Eosinophils Relative: 2 %
HEMATOCRIT: 43.3 % (ref 35.0–45.0)
Hemoglobin: 14.4 g/dL (ref 11.7–15.5)
LYMPHS PCT: 30 %
Lymphs Abs: 1860 cells/uL (ref 850–3900)
MCH: 28 pg (ref 27.0–33.0)
MCHC: 33.3 g/dL (ref 32.0–36.0)
MCV: 84.2 fL (ref 80.0–100.0)
MONO ABS: 496 {cells}/uL (ref 200–950)
MONOS PCT: 8 %
MPV: 9 fL (ref 7.5–12.5)
NEUTROS PCT: 59 %
Neutro Abs: 3658 cells/uL (ref 1500–7800)
PLATELETS: 260 10*3/uL (ref 140–400)
RBC: 5.14 MIL/uL — ABNORMAL HIGH (ref 3.80–5.10)
RDW: 14.6 % (ref 11.0–15.0)
WBC: 6.2 10*3/uL (ref 3.8–10.8)

## 2016-06-15 LAB — TSH: TSH: 2.27 m[IU]/L

## 2016-06-16 LAB — VITAMIN D 25 HYDROXY (VIT D DEFICIENCY, FRACTURES): Vit D, 25-Hydroxy: 66 ng/mL (ref 30–100)

## 2016-06-17 DIAGNOSIS — R69 Illness, unspecified: Secondary | ICD-10-CM | POA: Diagnosis not present

## 2016-06-18 DIAGNOSIS — D485 Neoplasm of uncertain behavior of skin: Secondary | ICD-10-CM | POA: Diagnosis not present

## 2016-06-19 ENCOUNTER — Encounter: Payer: Self-pay | Admitting: Internal Medicine

## 2016-06-19 ENCOUNTER — Ambulatory Visit (INDEPENDENT_AMBULATORY_CARE_PROVIDER_SITE_OTHER): Payer: Medicare HMO | Admitting: Internal Medicine

## 2016-06-19 VITALS — BP 100/70 | HR 68 | Temp 97.7°F | Ht 67.0 in | Wt 152.0 lb

## 2016-06-19 DIAGNOSIS — M170 Bilateral primary osteoarthritis of knee: Secondary | ICD-10-CM

## 2016-06-19 DIAGNOSIS — E7800 Pure hypercholesterolemia, unspecified: Secondary | ICD-10-CM

## 2016-06-19 DIAGNOSIS — Z0000 Encounter for general adult medical examination without abnormal findings: Secondary | ICD-10-CM | POA: Diagnosis not present

## 2016-06-19 DIAGNOSIS — M25552 Pain in left hip: Secondary | ICD-10-CM

## 2016-06-19 NOTE — Progress Notes (Signed)
Subjective:    Patient ID: Samantha Byrd, female    DOB: [birth year], 71 y.o.   MRN: [id number]  HPI 71 year old White Female for [employer], annual PE and evaluation of medical issues.  History of comminuted hip fracture all of 2015 repair by Dr. Jesse Sans at [facility] in [place]. Patient says that left hip feels abnormal and at times a bit uncomfortable. I've advised her to contact Dr. Jesse Sans  about this.  History of osteoporosis, right patellar dislocation, vaginal atrophy. She took Fosamax for several years beginning in 2001 and discontinued in 2011. Last bone density study 2007. Had sigmoidoscopy by Dr. Cristina Gong in 1998 but has refused colonoscopy. Refuses mammogram.   No known drug allergies  Last mammogram on file 2011.  She saw an endocrinologist in 2007 regarding treatment of osteoporosis. He recommended changing Fosamax to re-classed if bone density in the future should further decline. However she has not wanted to consider this.  She is to see Dr. Janan Halter for holistic medicine treatment of her knees but he has retired. History of right knee arthroscopic surgery for torn cartilage 1987. History of arthroscopic knee surgery left knee for torn cartilage 2002.  Social history: Has Masters degree. Married. Self-employed bookkeeper. Does not smoke or consume alcohol. One child.  Family history: Father died from complications of hip fracture recently. Apparently hip could not be repaired and he went home to pass away. He was in his 35s. One son in good health.    Review of Systems see above- no new complaint     Objective:   Physical Exam  Constitutional: She is oriented to person, place, and time. She appears well-developed and well-nourished. No distress.  HENT:  Head: Normocephalic and atraumatic.  Right Ear: External ear normal.  Left Ear: External ear normal.  Mouth/Throat: Oropharynx is clear and moist. No oropharyngeal exudate.  Eyes:  Conjunctivae and EOM are normal. Pupils are equal, round, and reactive to light. Right eye exhibits no discharge. Left eye exhibits no discharge. No scleral icterus.  Neck: Neck supple. No JVD present. No thyromegaly present.  Cardiovascular: Normal rate, regular rhythm, normal heart sounds and intact distal pulses.   No murmur heard. Pulmonary/Chest: Effort normal and breath sounds normal. No respiratory distress. She has no wheezes. She has no rales. She exhibits no tenderness.  Breasts normal female without masses  Abdominal: Soft. Bowel sounds are normal. She exhibits no distension and no mass. There is no tenderness. There is no rebound and no guarding.  Genitourinary:  Genitourinary Comments: Pap taken 2015. Bimanual normal.  Musculoskeletal: She exhibits no edema.  No pain with internal or external rotation of left hip  Lymphadenopathy:    She has no cervical adenopathy.  Neurological: She is alert and oriented to person, place, and time. She has normal reflexes. No cranial nerve deficit. Coordination normal.  Skin: Skin is warm and dry. No rash noted. She is not diaphoretic.  Psychiatric: She has a normal mood and affect. Her behavior is normal. Judgment and thought content normal.  Vitals reviewed.         Assessment & Plan:  History of left hip fracture repaired in [place]. She has some left hip discomfort and should return to see orthopedist  in [place] who repaired hip fracture  Osteoporosis-declines  treatment  History of osteoarthritis both knees  Hyperlipidemia-she has history of hypercholesterolemia. Does not want to be on medication. low carb. Lipids are abnormal. Total cholesterol is 312, triglycerides 118,  LDL cholesterol 218. 2 years ago she was also off of her diet total cholesterol was 267 and LDL cholesterol was 182.  Plan: Return in one year or as needed. She does not desire to have mammogram, flu or pneumonia vaccines.  Subjective:   Patient presents  for Medicare Annual/Subsequent preventive examination.  Review Past Medical/Family/Social:See above   Risk Factors  Current exercise habits: Very active outdoors. Dietary issues discussed: Low fat low carbohydrate  Cardiac risk factors:Hyperlipidemia  Depression Screen  (Note: if answer to either of the following is "Yes", a more complete depression screening is indicated)   Over the past two weeks, have you felt down, depressed or hopeless? No  Over the past two weeks, have you felt little interest or pleasure in doing things? No Have you lost interest or pleasure in daily life? No Do you often feel hopeless? No Do you cry easily over simple problems? No   Activities of Daily Living  In your present state of health, do you have any difficulty performing the following activities?:   Driving? No  Managing money? No  Feeding yourself? No  Getting from bed to chair? No  Climbing a flight of stairs? No  Preparing food and eating?: No  Bathing or showering? No  Getting dressed: No  Getting to the toilet? No  Using the toilet:No  Moving around from place to place: No  In the past year have you fallen or had a near fall?:No  Are you sexually active? No  Do you have more than one partner? No   Hearing Difficulties: No  Do you often ask people to speak up or repeat themselves? No  Do you experience ringing or noises in your ears? No  Do you have difficulty understanding soft or whispered voices? No  Do you feel that you have a problem with memory? No Do you often misplace items? No    Home Safety:  Do you have a smoke alarm at your residence? Yes Do you have grab bars in the bathroom?Yes Do you have throw rugs in your house? Yes hyperlipidemia   Cognitive Testing  Alert? Yes Normal Appearance?Yes  Oriented to person? Yes Place? Yes  Time? Yes  Recall of three objects? Yes  Can perform simple calculations? Yes  Displays appropriate judgment?Yes  Can read the correct  time from a watch face?Yes   List the Names of Other Physician/Practitioners you currently use:  See referral list for the physicians patient is currently seeing.     Review of Systems: See above   Objective:     General appearance: Appears stated age and mildly obese  Head: Normocephalic, without obvious abnormality, atraumatic  Eyes: conj clear, EOMi PEERLA  Ears: normal TM's and external ear canals both ears  Nose: Nares normal. Septum midline. Mucosa normal. No drainage or sinus tenderness.  Throat: lips, mucosa, and tongue normal; teeth and gums normal  Neck: no adenopathy, no carotid bruit, no JVD, supple, symmetrical, trachea midline and thyroid not enlarged, symmetric, no tenderness/mass/nodules  No CVA tenderness.  Lungs: clear to auscultation bilaterally  Breasts: normal appearance, no masses or tenderness Heart: regular rate and rhythm, S1, S2 normal, no murmur, click, rub or gallop  Abdomen: soft, non-tender; bowel sounds normal; no masses, no organomegaly  Musculoskeletal: ROM normal in all joints, no crepitus, no deformity, Normal muscle strengthen. Back  is symmetric, no curvature. Skin: Skin color, texture, turgor normal. No rashes or lesions  Lymph nodes: Cervical, supraclavicular, and axillary nodes normal.  Neurologic: CN 2 -12 Normal, Normal symmetric reflexes. Normal coordination and gait  Psych: Alert & Oriented x 3, Mood appear stable.    Assessment:    Annual wellness medicare exam   Plan:    During the course of the visit the patient was educated and counseled about appropriate screening and preventive services including:   Declines flu and pneumonia vaccines and mammogram     Patient Instructions (the written plan) was given to the patient.  Medicare Attestation  I have personally reviewed:  The patient's medical and social history  Their use of alcohol, tobacco or illicit drugs  Their current medications and supplements  The patient's  functional ability including ADLs,fall risks, home safety risks, cognitive, and hearing and visual impairment  Diet and physical activities  Evidence for depression or mood disorders  The patient's weight, height, BMI, and visual acuity have been recorded in the chart. I have made referrals, counseling, and provided education to the patient based on review of the above and I have provided the patient with a written personalized care plan for preventive services.

## 2016-06-24 DIAGNOSIS — L821 Other seborrheic keratosis: Secondary | ICD-10-CM | POA: Diagnosis not present

## 2016-06-24 DIAGNOSIS — L570 Actinic keratosis: Secondary | ICD-10-CM | POA: Diagnosis not present

## 2016-06-27 NOTE — Patient Instructions (Signed)
It was a pleasure to see you today. Watch diet. Continue exercise. Return in one year or as needed.

## 2016-07-02 DIAGNOSIS — L821 Other seborrheic keratosis: Secondary | ICD-10-CM | POA: Diagnosis not present

## 2016-07-02 DIAGNOSIS — L570 Actinic keratosis: Secondary | ICD-10-CM | POA: Diagnosis not present

## 2016-10-20 DIAGNOSIS — L810 Postinflammatory hyperpigmentation: Secondary | ICD-10-CM | POA: Diagnosis not present

## 2016-10-20 DIAGNOSIS — D225 Melanocytic nevi of trunk: Secondary | ICD-10-CM | POA: Diagnosis not present

## 2016-10-20 DIAGNOSIS — L570 Actinic keratosis: Secondary | ICD-10-CM | POA: Diagnosis not present

## 2016-10-20 DIAGNOSIS — L814 Other melanin hyperpigmentation: Secondary | ICD-10-CM | POA: Diagnosis not present

## 2016-10-20 DIAGNOSIS — L821 Other seborrheic keratosis: Secondary | ICD-10-CM | POA: Diagnosis not present

## 2016-11-30 ENCOUNTER — Telehealth: Payer: Self-pay | Admitting: Internal Medicine

## 2016-11-30 ENCOUNTER — Encounter: Payer: Self-pay | Admitting: Internal Medicine

## 2016-11-30 MED ORDER — SCOPOLAMINE 1 MG/3DAYS TD PT72
1.0000 | MEDICATED_PATCH | TRANSDERMAL | 12 refills | Status: DC
Start: 2016-11-30 — End: 2018-10-31

## 2016-11-30 NOTE — Telephone Encounter (Signed)
Pt requesting Transderm scop patches for a cruise

## 2017-04-12 DIAGNOSIS — M25552 Pain in left hip: Secondary | ICD-10-CM | POA: Diagnosis not present

## 2017-04-12 DIAGNOSIS — M25551 Pain in right hip: Secondary | ICD-10-CM | POA: Diagnosis not present

## 2017-04-20 DIAGNOSIS — R531 Weakness: Secondary | ICD-10-CM | POA: Diagnosis not present

## 2017-04-20 DIAGNOSIS — M7601 Gluteal tendinitis, right hip: Secondary | ICD-10-CM | POA: Diagnosis not present

## 2017-04-20 DIAGNOSIS — M25552 Pain in left hip: Secondary | ICD-10-CM | POA: Diagnosis not present

## 2017-08-18 DIAGNOSIS — R69 Illness, unspecified: Secondary | ICD-10-CM | POA: Diagnosis not present

## 2017-10-06 ENCOUNTER — Encounter (HOSPITAL_COMMUNITY): Payer: Self-pay | Admitting: Emergency Medicine

## 2017-10-06 ENCOUNTER — Other Ambulatory Visit: Payer: Self-pay

## 2017-10-06 ENCOUNTER — Emergency Department (HOSPITAL_COMMUNITY)
Admission: EM | Admit: 2017-10-06 | Discharge: 2017-10-06 | Disposition: A | Discharge status: Home / Self Care | Payer: Medicare HMO | Attending: Emergency Medicine | Admitting: Emergency Medicine

## 2017-10-06 ENCOUNTER — Emergency Department (HOSPITAL_COMMUNITY): Payer: Medicare HMO

## 2017-10-06 DIAGNOSIS — Y9351 Activity, roller skating (inline) and skateboarding: Secondary | ICD-10-CM | POA: Diagnosis not present

## 2017-10-06 DIAGNOSIS — Z79899 Other long term (current) drug therapy: Secondary | ICD-10-CM | POA: Insufficient documentation

## 2017-10-06 DIAGNOSIS — S52612A Displaced fracture of left ulna styloid process, initial encounter for closed fracture: Secondary | ICD-10-CM | POA: Insufficient documentation

## 2017-10-06 DIAGNOSIS — R079 Chest pain, unspecified: Secondary | ICD-10-CM | POA: Diagnosis not present

## 2017-10-06 DIAGNOSIS — Y999 Unspecified external cause status: Secondary | ICD-10-CM | POA: Insufficient documentation

## 2017-10-06 DIAGNOSIS — Z87891 Personal history of nicotine dependence: Secondary | ICD-10-CM | POA: Insufficient documentation

## 2017-10-06 DIAGNOSIS — S62112A Displaced fracture of triquetrum [cuneiform] bone, left wrist, initial encounter for closed fracture: Secondary | ICD-10-CM | POA: Diagnosis not present

## 2017-10-06 DIAGNOSIS — S52502A Unspecified fracture of the lower end of left radius, initial encounter for closed fracture: Secondary | ICD-10-CM

## 2017-10-06 DIAGNOSIS — Y929 Unspecified place or not applicable: Secondary | ICD-10-CM | POA: Insufficient documentation

## 2017-10-06 DIAGNOSIS — S52572A Other intraarticular fracture of lower end of left radius, initial encounter for closed fracture: Secondary | ICD-10-CM | POA: Diagnosis not present

## 2017-10-06 DIAGNOSIS — S6992XA Unspecified injury of left wrist, hand and finger(s), initial encounter: Secondary | ICD-10-CM | POA: Diagnosis present

## 2017-10-06 IMAGING — DX DG CHEST 2V
2 series · 2 of 2 positions shown · non-contrast
Comparison: None.

CLINICAL DATA: Fall while roller-skating with chest pain, initial
encounter

EXAM:
CHEST - 2 VIEW

[w chest pa]
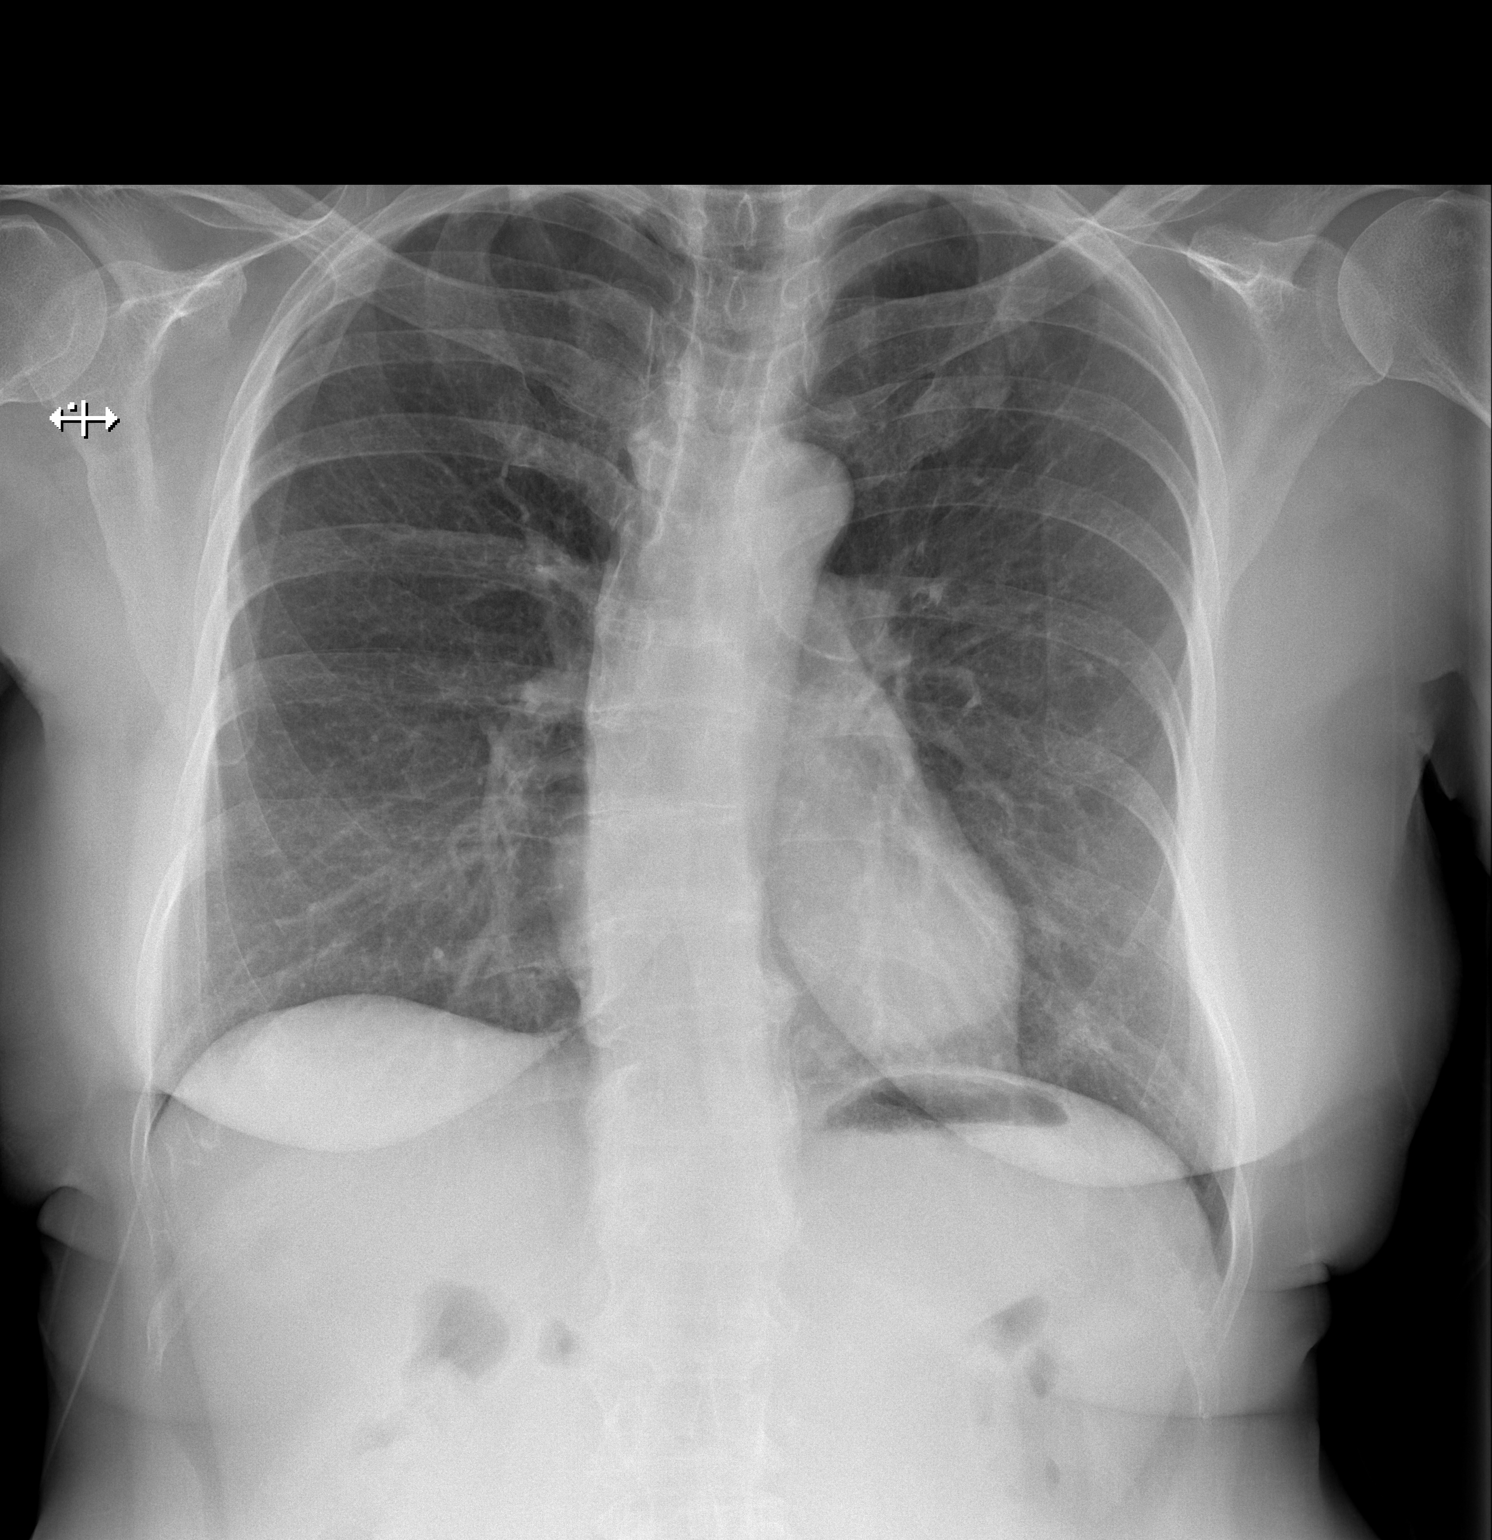

[w chest lat]
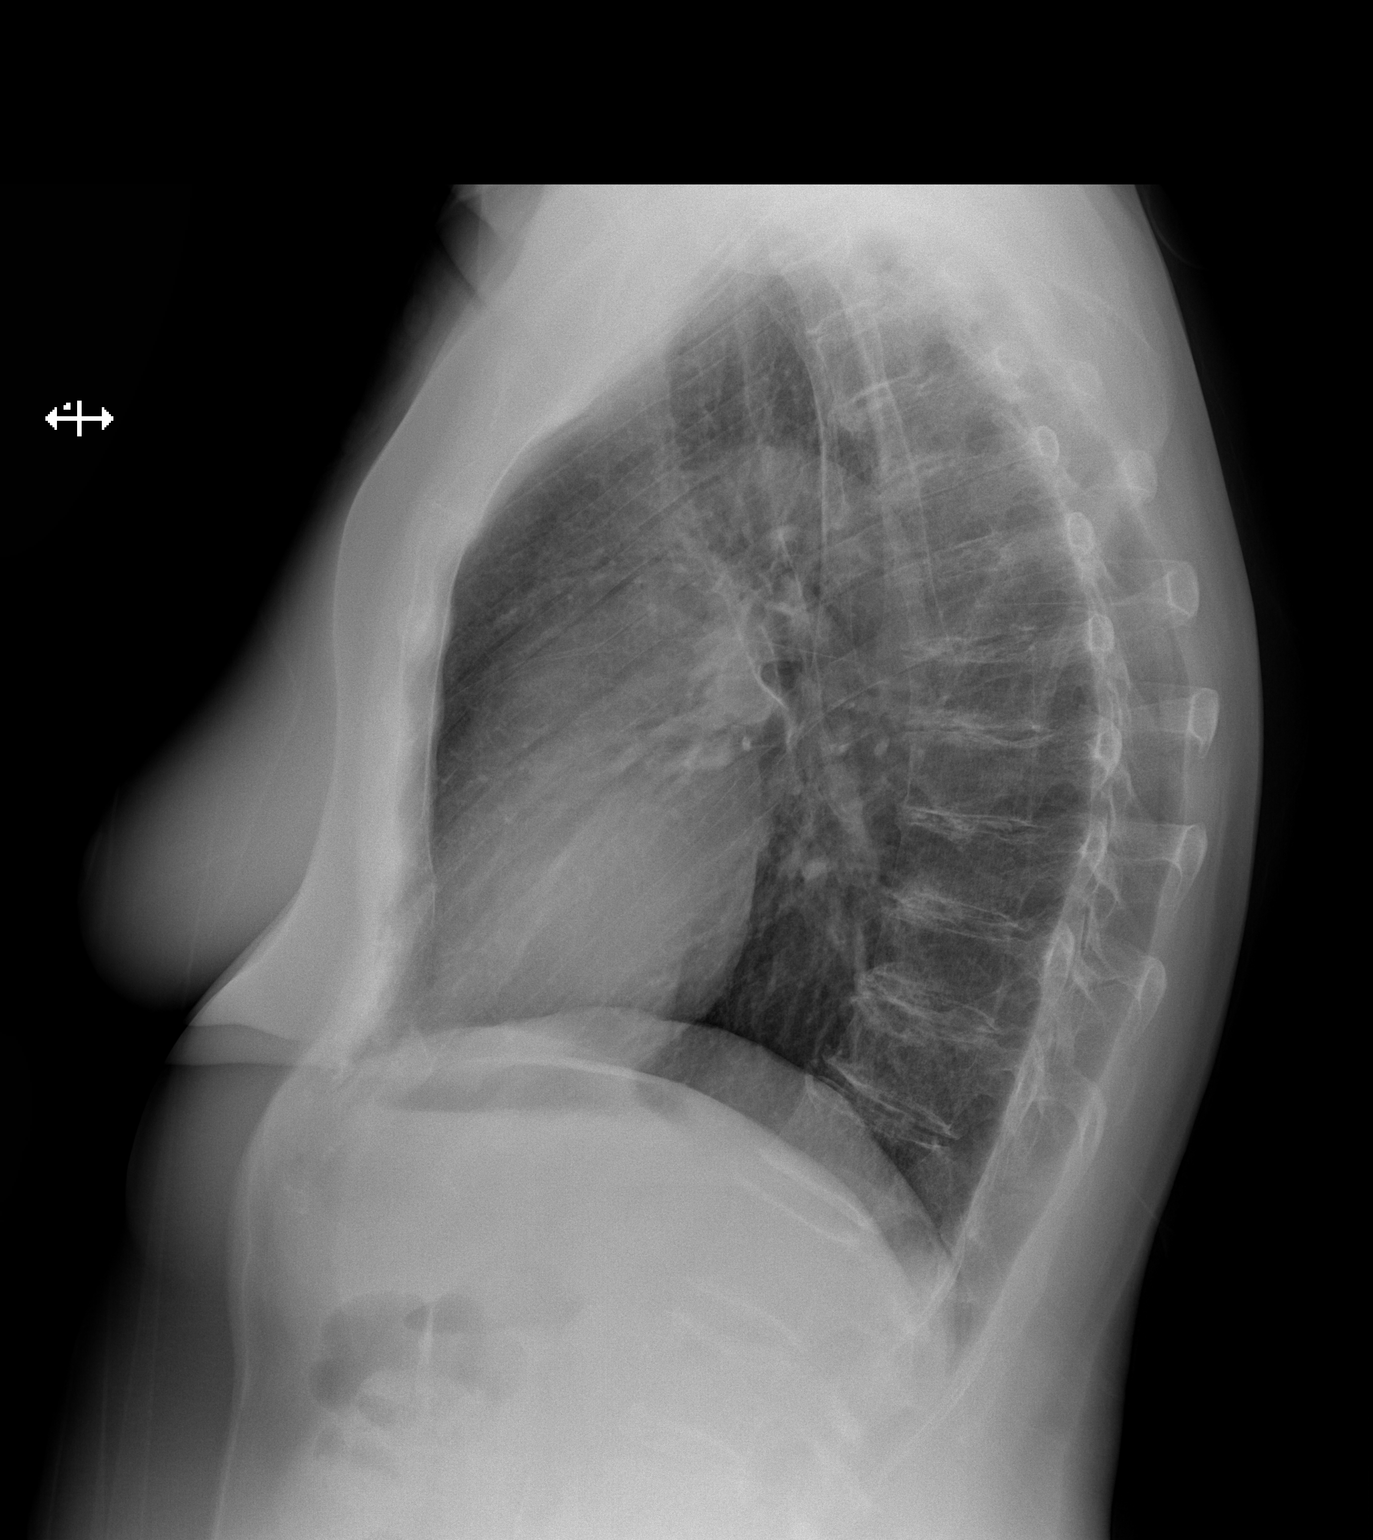

[2 of 2 positions shown; findings below may reference images not displayed]

FINDINGS: Cardiac shadow is within normal limits. The lungs are well aerated
bilaterally. No focal infiltrate or sizable effusion is seen. No
pneumothorax is noted. No gross bony abnormality is seen.
IMPRESSION: No acute abnormality noted.

## 2017-10-06 MED ORDER — HYDROCODONE-ACETAMINOPHEN 5-325 MG PO TABS: 1.0000 | ORAL_TABLET | Freq: Four times a day (QID) | ORAL | 0 refills | Status: DC | PRN

## 2017-10-06 NOTE — ED Provider Notes (Signed)
[facility] EMERGENCY DEPARTMENT Provider Note   CSN: [id number] Arrival date & time: 10/06/17  1500     History   Chief Complaint Chief Complaint  Patient presents with  . Arm Pain    HPI Samantha Byrd is a 72 y.o. female.  HPI   72 year old female presents today with complaints of left hand and wrist pain.  Patient is right-hand dominant.  Patient notes she was rollerblading with her grandchildren when she fell on the hand.  She notes she also hit her chest as well.  Patient reports no medications prior to arrival, she denies any loss of distal sensation strength nor any other neurological deficits.   Past Medical History:  Diagnosis Date  . Arthritis   . Osteoporosis     Patient Active Problem List   Diagnosis Date Noted  . Osteopenia 02/08/2016  . Femur fracture, left ([place]) 04/17/2014  . Hyperlipidemia 09/27/2011  . Left knee pain 09/25/2011    Past Surgical History:  Procedure Laterality Date  . FRACTURE SURGERY Left 03/07/2014  . KNEE ARTHROSCOPY Bilateral      OB History   None     Home Medications    Prior to Admission medications   Medication Sig Start Date End Date Taking? Authorizing Provider  Aloe Vera 25 MG CAPS Take by mouth.    [provider]  aspirin 325 MG tablet Take 325 mg by mouth 2 (two) times daily.    [provider]  Cholecalciferol 1000 UNITS capsule Take 1,000 Units by mouth daily.    [provider]  Cod Liver Oil 1000 MG CAPS Take by mouth.    [provider]  glucosamine-chondroitin 500-400 MG tablet Take by mouth 3 (three) times daily.    [provider]  HYDROcodone-acetaminophen (NORCO/VICODIN) 5-325 MG tablet Take 1 tablet by mouth every 6 (six) hours as needed. 10/06/17   Nichael Ehly, Dellis Filbert, PA-C  magnesium gluconate (MAGONATE) 30 MG tablet Take 30 mg by mouth daily.    [provider]  Multiple Vitamins-Minerals (ANTIOXIDANT FORMULA SG) capsule Take 1  capsule by mouth daily.    [provider]  scopolamine (TRANSDERM-SCOP, 1.5 MG,) 1 MG/3DAYS Place 1 patch (1.5 mg total) onto the skin every 3 (three) days. 11/30/16   Elby Showers, MD  VITAMIN K PO Take by mouth.    [provider]    Family History Family History  Problem Relation Age of Onset  . Osteoarthritis Father     Social History Social History   Tobacco Use  . Smoking status: Former Smoker    Types: Cigarettes    Last attempt to quit: 05/27/1964    Years since quitting: 53.3  . Smokeless tobacco: Never Used  Substance Use Topics  . Alcohol use: Yes    Comment: occasional  . Drug use: No     Allergies   Iohexol and Orange juice [orange oil]   Review of Systems Review of Systems  All other systems reviewed and are negative.   Physical Exam Updated Vital Signs BP (!) 129/53 (BP Location: Right Arm)   Pulse 64   Temp 97.9 F (36.6 C) (Oral)   Resp 18   SpO2 100%   Physical Exam  Constitutional: She is oriented to person, place, and time. She appears well-developed and well-nourished.  HENT:  Head: Normocephalic and atraumatic.  Eyes: Pupils are equal, round, and reactive to light. Conjunctivae are normal. Right eye exhibits no discharge. Left eye exhibits no  discharge. No scleral icterus.  Neck: Normal range of motion. No JVD present. No tracheal deviation present.  Pulmonary/Chest: Effort normal. No stridor.  Musculoskeletal:  Obvious deformity of the left distal wrist, tenderness throughout the hand and wrist, radial pulse 2+, incision intact full active range of motion and strength of the fingers  Neurological: She is alert and oriented to person, place, and time. Coordination normal.  Psychiatric: She has a normal mood and affect. Her behavior is normal. Judgment and thought content normal.  Nursing note and vitals reviewed.    ED Treatments / Results  Labs (all labs ordered are listed, but only abnormal results are  displayed) Labs Reviewed - No data to display  EKG None  Radiology Dg Chest 2 View  Result Date: 10/06/2017 CLINICAL DATA:  Fall while roller-skating with chest pain, initial encounter EXAM: CHEST - 2 VIEW COMPARISON:  None. FINDINGS: Cardiac shadow is within normal limits. The lungs are well aerated bilaterally. No focal infiltrate or sizable effusion is seen. No pneumothorax is noted. No gross bony abnormality is seen. IMPRESSION: No acute abnormality noted. Electronically Signed   By: Inez Catalina M.D.   On: 10/06/2017 16:36   Dg Wrist Complete Left  Result Date: 10/06/2017 CLINICAL DATA:  Left wrist deformity after fall. EXAM: LEFT WRIST - COMPLETE 3+ VIEW COMPARISON:  None. FINDINGS: Comminuted, intra-articular fracture of the distal radius with dorsal angulation. Mildly displaced fracture of the ulnar styloid process. Cortical irregularity along the dorsal aspect of the proximal carpal row on the lateral view, consistent with underlying triquetrum fracture. No dislocation. Joint spaces are preserved. Mild diffuse osteopenia. Moderate soft tissue swelling about the wrist. IMPRESSION: 1. Comminuted, intra-articular fracture of the distal radius with dorsal angulation. 2. Mildly displaced fracture of the ulnar styloid process. 3. Acute dorsal triquetrum fracture. Electronically Signed   By: Titus Dubin M.D.   On: 10/06/2017 16:37    Procedures Procedures (including critical care time)  Medications Ordered in ED Medications - No data to display   Initial Impression / Assessment and Plan / ED Course  I have reviewed the triage vital signs and the nursing notes.  Pertinent labs & imaging results that were available during my care of the patient were reviewed by me and considered in my medical decision making (see chart for details).     Labs:   Imaging: DG wrist complete left. DG hand complete right   Consults: Dr. Burney Gauze   Therapeutics:  Discharge Meds: hydrocodone    Assessment/Plan: 71 year old female presents today with distal radius fracture.  Patient also has fracture of the triquetrum and ulnar styloid.  No neurovascular involvement.  Case was discussed with on-call hand surgeon who recommended splint with close outpatient follow-up.  Patient will follow-up tomorrow as an outpatient for likely or management.  Patient would not take any pain medication here, patient will be discharged with pain medication as needed, encouraged to elevate extremity, follow-up as directed return for any new or worsening signs or symptoms.  She verbalized understanding and agreement to today's plan had no further questions or concerns.   Final Clinical Impressions(s) / ED Diagnoses   Final diagnoses:  Closed fracture of distal end of left radius, unspecified fracture morphology, initial encounter    ED Discharge Orders        Ordered    HYDROcodone-acetaminophen (NORCO/VICODIN) 5-325 MG tablet  Every 6 hours PRN     10/06/17 1908       Okey Regal, PA-C 10/06/17 1914  Sherwood Gambler, MD 10/07/17 [phone]

## 2017-10-06 NOTE — ED Triage Notes (Signed)
Patient to ED after fall while roller skating. Reports she fell on her bottom and had her L arm outstretched. Obvious deformity and swelling noted to L wrist. Sensation and motor intact distal to injury. Patient ambulatory without difficulty. Did not hit head, no neck pain, but does endorse chest wall pain with movement.

## 2017-10-06 NOTE — Discharge Instructions (Addendum)
Please read attached information. If you experience any new or worsening signs or symptoms please return to the emergency room for evaluation. Please follow-up with your primary care provider or specialist as discussed. Please use medication prescribed only as directed and discontinue taking if you have any concerning signs or symptoms.   °

## 2017-10-06 NOTE — Progress Notes (Signed)
Orthopedic Tech Progress Note Patient Details:  Samantha Byrd [birth year] [id number]  Ortho Devices Type of Ortho Device: Ace wrap, Arm sling, Sugartong splint Ortho Device/Splint Location: lue Ortho Device/Splint Interventions: Application   Post Interventions Patient Tolerated: Well Instructions Provided: Care of device   Hildred Priest 10/06/2017, 6:56 PM

## 2017-10-06 NOTE — ED Notes (Signed)
Patient given ice pack and warm blankets.

## 2017-10-06 NOTE — ED Notes (Signed)
PT states understanding of care given, follow up care, and medication prescribed. PT ambulated from ED to car with a steady gait. 

## 2017-10-06 NOTE — ED Provider Notes (Signed)
Patient placed in Quick Look pathway, seen and evaluated   Chief Complaint: wrist inj  HPI:   Fall roller skating. No LOC Left wrist deformity Rhand dominant. No numbness  ROS: Wrist pain, chest tenderness (one)  Physical Exam:   Gen: No distress  Neuro: Awake and Alert  Skin: Warm    Focused Exam: R wrist deformity   Initiation of care has begun. The patient has been counseled on the process, plan, and necessity for staying for the completion/evaluation, and the remainder of the medical screening examination    Margarita Mail, PA-C 10/06/17 1533    Pattricia Boss, MD 10/07/17 1224

## 2017-10-07 DIAGNOSIS — S52502A Unspecified fracture of the lower end of left radius, initial encounter for closed fracture: Secondary | ICD-10-CM | POA: Insufficient documentation

## 2017-10-07 DIAGNOSIS — S52572A Other intraarticular fracture of lower end of left radius, initial encounter for closed fracture: Secondary | ICD-10-CM | POA: Diagnosis not present

## 2017-10-08 DIAGNOSIS — S52572A Other intraarticular fracture of lower end of left radius, initial encounter for closed fracture: Secondary | ICD-10-CM | POA: Diagnosis not present

## 2017-10-08 DIAGNOSIS — G8918 Other acute postprocedural pain: Secondary | ICD-10-CM | POA: Diagnosis not present

## 2017-10-08 DIAGNOSIS — Y999 Unspecified external cause status: Secondary | ICD-10-CM | POA: Diagnosis not present

## 2017-10-11 DIAGNOSIS — M25632 Stiffness of left wrist, not elsewhere classified: Secondary | ICD-10-CM | POA: Diagnosis not present

## 2017-10-11 DIAGNOSIS — M25532 Pain in left wrist: Secondary | ICD-10-CM | POA: Diagnosis not present

## 2017-10-11 DIAGNOSIS — S52572A Other intraarticular fracture of lower end of left radius, initial encounter for closed fracture: Secondary | ICD-10-CM | POA: Diagnosis not present

## 2017-10-11 DIAGNOSIS — R531 Weakness: Secondary | ICD-10-CM | POA: Diagnosis not present

## 2017-10-11 DIAGNOSIS — M2560 Stiffness of unspecified joint, not elsewhere classified: Secondary | ICD-10-CM | POA: Diagnosis not present

## 2017-10-19 DIAGNOSIS — R531 Weakness: Secondary | ICD-10-CM | POA: Diagnosis not present

## 2017-10-19 DIAGNOSIS — M2560 Stiffness of unspecified joint, not elsewhere classified: Secondary | ICD-10-CM | POA: Diagnosis not present

## 2017-10-19 DIAGNOSIS — M25632 Stiffness of left wrist, not elsewhere classified: Secondary | ICD-10-CM | POA: Diagnosis not present

## 2017-10-19 DIAGNOSIS — M25532 Pain in left wrist: Secondary | ICD-10-CM | POA: Diagnosis not present

## 2017-10-19 DIAGNOSIS — S52572A Other intraarticular fracture of lower end of left radius, initial encounter for closed fracture: Secondary | ICD-10-CM | POA: Diagnosis not present

## 2017-10-28 ENCOUNTER — Telehealth: Payer: Self-pay

## 2017-10-28 DIAGNOSIS — S52502S Unspecified fracture of the lower end of left radius, sequela: Secondary | ICD-10-CM

## 2017-10-28 NOTE — Telephone Encounter (Signed)
Received a call from [facility] they said patient recently had a fracture (10/06/17) and they would like for her to have a bone density test. They would like to know if we order this? Dx: recent fracture. Thank you.

## 2017-10-28 NOTE — Telephone Encounter (Signed)
I have addressed this by fax. She has refused in the past. Please put in the order and call the patient and ask her to go.

## 2017-10-29 NOTE — Telephone Encounter (Signed)
Left detailed message letting her know. Order put in.

## 2017-11-02 DIAGNOSIS — M25632 Stiffness of left wrist, not elsewhere classified: Secondary | ICD-10-CM | POA: Diagnosis not present

## 2017-11-02 DIAGNOSIS — S52572A Other intraarticular fracture of lower end of left radius, initial encounter for closed fracture: Secondary | ICD-10-CM | POA: Diagnosis not present

## 2017-11-02 DIAGNOSIS — M25642 Stiffness of left hand, not elsewhere classified: Secondary | ICD-10-CM | POA: Diagnosis not present

## 2017-11-18 ENCOUNTER — Other Ambulatory Visit: Payer: Self-pay | Admitting: Internal Medicine

## 2017-11-18 DIAGNOSIS — E2839 Other primary ovarian failure: Secondary | ICD-10-CM

## 2017-11-24 ENCOUNTER — Other Ambulatory Visit: Payer: Self-pay | Admitting: Internal Medicine

## 2017-11-24 DIAGNOSIS — E2839 Other primary ovarian failure: Secondary | ICD-10-CM

## 2017-11-24 DIAGNOSIS — Z1231 Encounter for screening mammogram for malignant neoplasm of breast: Secondary | ICD-10-CM

## 2017-12-07 DIAGNOSIS — S52572A Other intraarticular fracture of lower end of left radius, initial encounter for closed fracture: Secondary | ICD-10-CM | POA: Diagnosis not present

## 2018-01-19 ENCOUNTER — Ambulatory Visit
Admission: RE | Admit: 2018-01-19 | Discharge: 2018-01-19 | Disposition: A | Discharge status: Home / Self Care | Payer: Medicare HMO | Source: Ambulatory Visit | Attending: Internal Medicine | Admitting: Internal Medicine

## 2018-01-19 DIAGNOSIS — E2839 Other primary ovarian failure: Secondary | ICD-10-CM

## 2018-01-19 DIAGNOSIS — M8589 Other specified disorders of bone density and structure, multiple sites: Secondary | ICD-10-CM | POA: Diagnosis not present

## 2018-01-19 DIAGNOSIS — Z780 Asymptomatic menopausal state: Secondary | ICD-10-CM | POA: Diagnosis not present

## 2018-03-21 ENCOUNTER — Other Ambulatory Visit: Payer: Medicare HMO | Admitting: Internal Medicine

## 2018-03-21 DIAGNOSIS — E7800 Pure hypercholesterolemia, unspecified: Secondary | ICD-10-CM

## 2018-03-21 DIAGNOSIS — M170 Bilateral primary osteoarthritis of knee: Secondary | ICD-10-CM

## 2018-03-21 DIAGNOSIS — Z0000 Encounter for general adult medical examination without abnormal findings: Secondary | ICD-10-CM

## 2018-03-21 DIAGNOSIS — E2839 Other primary ovarian failure: Secondary | ICD-10-CM

## 2018-03-22 LAB — CBC WITH DIFFERENTIAL/PLATELET
BASOS PCT: 0.6 %
Basophils Absolute: 40 cells/uL (ref 0–200)
Eosinophils Absolute: 107 cells/uL (ref 15–500)
Eosinophils Relative: 1.6 %
HCT: 43 % (ref 35.0–45.0)
HEMOGLOBIN: 14.6 g/dL (ref 11.7–15.5)
Lymphs Abs: 1735 cells/uL (ref 850–3900)
MCH: 28.8 pg (ref 27.0–33.0)
MCHC: 34 g/dL (ref 32.0–36.0)
MCV: 84.8 fL (ref 80.0–100.0)
MPV: 9.3 fL (ref 7.5–12.5)
Monocytes Relative: 6.5 %
Neutro Abs: 4382 cells/uL (ref 1500–7800)
Neutrophils Relative %: 65.4 %
Platelets: 295 10*3/uL (ref 140–400)
RBC: 5.07 10*6/uL (ref 3.80–5.10)
RDW: 13.7 % (ref 11.0–15.0)
Total Lymphocyte: 25.9 %
WBC: 6.7 10*3/uL (ref 3.8–10.8)
WBCMIX: 436 {cells}/uL (ref 200–950)

## 2018-03-22 LAB — COMPLETE METABOLIC PANEL WITH GFR
AG Ratio: 1.5 (calc) (ref 1.0–2.5)
ALBUMIN MSPROF: 4.5 g/dL (ref 3.6–5.1)
ALKALINE PHOSPHATASE (APISO): 123 U/L (ref 33–130)
ALT: 32 U/L — ABNORMAL HIGH (ref 6–29)
AST: 32 U/L (ref 10–35)
BUN: 21 mg/dL (ref 7–25)
CALCIUM: 10.4 mg/dL (ref 8.6–10.4)
CO2: 27 mmol/L (ref 20–32)
CREATININE: 0.81 mg/dL (ref 0.60–0.93)
Chloride: 100 mmol/L (ref 98–110)
GFR, EST NON AFRICAN AMERICAN: 73 mL/min/{1.73_m2} (ref >=60)
GFR, Est African American: 84 mL/min/{1.73_m2} (ref >=60)
Globulin: 3.1 g/dL (calc) (ref 1.9–3.7)
Glucose, Bld: 72 mg/dL (ref 65–99)
Potassium: 4.4 mmol/L (ref 3.5–5.3)
Sodium: 138 mmol/L (ref 135–146)
Total Bilirubin: 0.6 mg/dL (ref 0.2–1.2)
Total Protein: 7.6 g/dL (ref 6.1–8.1)

## 2018-03-22 LAB — LIPID PANEL
Cholesterol: 327 mg/dL — ABNORMAL HIGH (ref <200)
HDL: 79 mg/dL (ref >50)
LDL CHOLESTEROL (CALC): 225 mg/dL — AB
Non-HDL Cholesterol (Calc): 248 mg/dL (calc) — ABNORMAL HIGH (ref <130)
Total CHOL/HDL Ratio: 4.1 (calc) (ref <5.0)
Triglycerides: 102 mg/dL (ref <150)

## 2018-03-22 LAB — TSH: TSH: 1.96 mIU/L (ref 0.40–4.50)

## 2018-03-23 ENCOUNTER — Ambulatory Visit (INDEPENDENT_AMBULATORY_CARE_PROVIDER_SITE_OTHER): Payer: Medicare HMO | Admitting: Internal Medicine

## 2018-03-23 ENCOUNTER — Encounter: Payer: Self-pay | Admitting: Internal Medicine

## 2018-03-23 VITALS — BP 110/70 | HR 77 | Temp 98.0°F | Ht 67.0 in | Wt 147.0 lb

## 2018-03-23 DIAGNOSIS — Z0000 Encounter for general adult medical examination without abnormal findings: Secondary | ICD-10-CM

## 2018-03-23 DIAGNOSIS — R8290 Unspecified abnormal findings in urine: Secondary | ICD-10-CM | POA: Diagnosis not present

## 2018-03-23 DIAGNOSIS — E7800 Pure hypercholesterolemia, unspecified: Secondary | ICD-10-CM | POA: Diagnosis not present

## 2018-03-23 DIAGNOSIS — M810 Age-related osteoporosis without current pathological fracture: Secondary | ICD-10-CM | POA: Diagnosis not present

## 2018-03-23 DIAGNOSIS — Z8781 Personal history of (healed) traumatic fracture: Secondary | ICD-10-CM

## 2018-03-23 LAB — POCT URINALYSIS DIPSTICK
APPEARANCE: NEGATIVE
Bilirubin, UA: NEGATIVE
Blood, UA: NEGATIVE
GLUCOSE UA: NEGATIVE
KETONES UA: NEGATIVE
Nitrite, UA: NEGATIVE
ODOR: NEGATIVE
PH UA: 6.5 (ref 5.0–8.0)
Protein, UA: NEGATIVE
SPEC GRAV UA: 1.01 (ref 1.010–1.025)
UROBILINOGEN UA: 0.2 U/dL

## 2018-03-23 NOTE — Progress Notes (Signed)
Subjective:    Patient ID: Samantha Byrd, female    DOB: 1946-02-03, 72 y.o.   MRN: [id number]  HPI 72 year old Female for health maintenance exam and evaluation of medical issues.  Taking Strontium for osteopenia. Bone density October 2019 has improved in spine from - 2.3  In  2017  to -1.  History of comminuted hip fracture in the fall 2015 repaired by Dr. Jesse Sans at [facility] in [place].  History of osteoporosis, right patellar dislocation, vaginal atrophy.  She took Fosamax between 2001 and 2011.  Had sigmoidoscopy by Dr. Cristina Gong in 1998 but has refused colonoscopy.  Refuses mammogram.  Last mammogram on file 2011.  No known drug allergies.  She saw an endocrinologist in 2007 regarding treatment for osteoporosis.  He recommended changing Fosamax to Reclast if there should be further decline in bone density study however she has not wanted to do this.  She used to see Dr. Carole Civil for holistic medicine treatment for her knees but he has retired and she wants to be referred to someone else in the [place] area.  History of right knee arthroscopic surgery for torn cartilage 1997.  History of arthroscopic knee surgery left knee for torn cartilage 2002.  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  One child.  Family history: Father died from complications of hip fracture.  Apparently hip could not be repaired and he went home to pass away.  He was in his 41s.  One son in good health.    Review of Systems no new complaints     Objective:   Physical Exam Vitals signs reviewed.  Constitutional:      General: She is not in acute distress.    Appearance: Normal appearance. She is not diaphoretic.  HENT:     Head: Normocephalic and atraumatic.     Right Ear: Tympanic membrane normal.     Left Ear: Tympanic membrane normal.     Mouth/Throat:     Pharynx: Oropharynx is clear.    Eyes:     General: No scleral icterus.       Right eye: No discharge.        Left eye: No discharge.     Extraocular Movements: Extraocular movements intact.     Pupils: Pupils are equal, round, and reactive to light.  Cardiovascular:     Rate and Rhythm: Normal rate and regular rhythm.     Pulses: Normal pulses.     Heart sounds: No murmur.     Comments: Breast without masses Pulmonary:     Effort: Pulmonary effort is normal. No respiratory distress.     Breath sounds: Normal breath sounds. No wheezing.  Abdominal:     General: There is no distension.     Palpations: Abdomen is soft. There is no mass.     Tenderness: There is no abdominal tenderness. There is no guarding or rebound.  Genitourinary:    Comments: Bimanual normal.  Last Pap was 2015 Lymphadenopathy:     Cervical: No cervical adenopathy.  Skin:    General: Skin is warm and dry.  Neurological:     General: No focal deficit present.     Mental Status: She is alert and oriented to person, place, and time.     Cranial Nerves: No cranial nerve deficit.  Psychiatric:        Mood and Affect: Mood normal.  Behavior: Behavior normal.        Thought Content: Thought content normal.           Assessment & Plan:  Abnormal urine dipstick with 2+ LE.  Culture sent  Bilateral knee pain-make referral to physician of her choice in [facility] for holistic medicine treatment  Hyperlipidemia-total cholesterol 327 and LDL cholesterol 225-refuses statin treatment.  History of osteoporosis-wants to continue with strontium and no other medication  History of left hip fracture repaired in [place].  Return in 1 year or as needed.  Refuses flu and pneumonia vaccines.  Health maintenance-refuses mammogram  Subjective:   Patient presents for Medicare Annual/Subsequent preventive examination.  Review Past Medical/Family/Social: See above   Risk Factors  Current exercise habits: Gets moderate exercise Dietary  issues discussed: Low-fat low carbohydrate  Cardiac risk factors: Hyperlipidemia  Depression Screen  (Note: if answer to either of the following is "Yes", a more complete depression screening is indicated)   Over the past two weeks, have you felt down, depressed or hopeless? No  Over the past two weeks, have you felt little interest or pleasure in doing things? No Have you lost interest or pleasure in daily life? No Do you often feel hopeless? No Do you cry easily over simple problems? No   Activities of Daily Living  In your present state of health, do you have any difficulty performing the following activities?:   Driving? No  Managing money? No  Feeding yourself? No  Getting from bed to chair? No  Climbing a flight of stairs? No  Preparing food and eating?: No  Bathing or showering? No  Getting dressed: No  Getting to the toilet? No  Using the toilet:No  Moving around from place to place: No  In the past year have you fallen or had a near fall?:  Yes and broke wrist June 2019 Are you sexually active? No  Do you have more than one partner? No   Hearing Difficulties: No  Do you often ask people to speak up or repeat themselves? No  Do you experience ringing or noises in your ears? No  Do you have difficulty understanding soft or whispered voices? No  Do you feel that you have a problem with memory? No Do you often misplace items?  Sometimes   Home Safety:  Do you have a smoke alarm at your residence? Yes Do you have grab bars in the bathroom?  Yes Do you have throw rugs in your house?  Yes   Cognitive Testing  Alert? Yes Normal Appearance?Yes  Oriented to person? Yes Place? Yes  Time? Yes  Recall of three objects? Yes  Can perform simple calculations? Yes  Displays appropriate judgment?Yes  Can read the correct time from a watch face?Yes   List the Names of Other Physician/Practitioners you currently use:  See referral list for the physicians patient is  currently seeing.     Review of Systems: See above   Objective:     General appearance: Appears stated age  Head: Normocephalic, without obvious abnormality, atraumatic  Eyes: conj clear, EOMi PEERLA  Ears: normal TM's and external ear canals both ears  Nose: Nares normal. Septum midline. Mucosa normal. No drainage or sinus tenderness.  Throat: lips, mucosa, and tongue normal; teeth and gums normal  Neck: no adenopathy, no carotid bruit, no JVD, supple, symmetrical, trachea midline and thyroid not enlarged, symmetric, no tenderness/mass/nodules  No CVA tenderness.  Lungs: clear to auscultation bilaterally  Breasts: normal appearance, no  masses or tenderness Heart: regular rate and rhythm, S1, S2 normal, no murmur, click, rub or gallop  Abdomen: soft, non-tender; bowel sounds normal; no masses, no organomegaly  Musculoskeletal: ROM normal in all joints, no crepitus, no deformity, Normal muscle strengthen. Back  is symmetric, no curvature. Skin: Skin color, texture, turgor normal. No rashes or lesions  Lymph nodes: Cervical, supraclavicular, and axillary nodes normal.  Neurologic: CN 2 -12 Normal, Normal symmetric reflexes. Normal coordination and gait  Psych: Alert & Oriented x 3, Mood appear stable.    Assessment:    Annual wellness medicare exam   Plan:    During the course of the visit the patient was educated and counseled about appropriate screening and preventive services including:   Refuses flu and pneumonia vaccines.  Refuses mammogram.  Refuses conventional treatment for osteoporosis     Patient Instructions (the written plan) was given to the patient.  Medicare Attestation  I have personally reviewed:  The patient's medical and social history  Their use of alcohol, tobacco or illicit drugs  Their current medications and supplements  The patient's functional ability including ADLs,fall risks, home safety risks, cognitive, and hearing and visual impairment    Diet and physical activities  Evidence for depression or mood disorders  The patient's weight, height, BMI, and visual acuity have been recorded in the chart. I have made referrals, counseling, and provided education to the patient based on review of the above and I have provided the patient with a written personalized care plan for preventive services.

## 2018-03-24 ENCOUNTER — Other Ambulatory Visit: Payer: Medicare HMO | Admitting: Internal Medicine

## 2018-03-24 LAB — URINE CULTURE
MICRO NUMBER: 91484000
SPECIMEN QUALITY:: ADEQUATE

## 2018-03-29 ENCOUNTER — Encounter: Payer: Medicare HMO | Admitting: Internal Medicine

## 2018-04-12 ENCOUNTER — Encounter: Payer: Self-pay | Admitting: Internal Medicine

## 2018-04-12 NOTE — Patient Instructions (Signed)
It was a pleasure to see you today.  We understand you do not want medication for hyperlipidemia nor osteoporosis.  Return in 1 year or as needed.  We also understand you are refusing mammography.

## 2018-06-13 DIAGNOSIS — M357 Hypermobility syndrome: Secondary | ICD-10-CM | POA: Diagnosis not present

## 2018-06-13 DIAGNOSIS — M25569 Pain in unspecified knee: Secondary | ICD-10-CM | POA: Diagnosis not present

## 2018-06-15 DIAGNOSIS — R69 Illness, unspecified: Secondary | ICD-10-CM | POA: Diagnosis not present

## 2018-07-19 DIAGNOSIS — M1711 Unilateral primary osteoarthritis, right knee: Secondary | ICD-10-CM | POA: Diagnosis not present

## 2018-07-19 DIAGNOSIS — Z1389 Encounter for screening for other disorder: Secondary | ICD-10-CM | POA: Diagnosis not present

## 2018-07-19 DIAGNOSIS — M1712 Unilateral primary osteoarthritis, left knee: Secondary | ICD-10-CM | POA: Diagnosis not present

## 2018-07-21 DIAGNOSIS — Z1389 Encounter for screening for other disorder: Secondary | ICD-10-CM | POA: Diagnosis not present

## 2018-08-26 DIAGNOSIS — Z1389 Encounter for screening for other disorder: Secondary | ICD-10-CM | POA: Diagnosis not present

## 2018-10-27 ENCOUNTER — Telehealth: Payer: Self-pay | Admitting: Internal Medicine

## 2018-10-27 NOTE — Telephone Encounter (Signed)
After speaking with Dr Renold Genta, I called patient back to schedule office visit, she did not want to come in this week, stated it was not an emergrency so we scheduled for Monday.

## 2018-10-27 NOTE — Telephone Encounter (Addendum)
Samantha Byrd [phone]  Hermie called to schedule CPE and to see if she could get labs T4 and TSH (mother took thyroid medicine for 77 plus years and both sisters have thyroid issues., then she said she was having papulations and heart racing, dizziness. However she did say she can calm herself down and it gets better. While talking with her I seen her CPE is not due until after 03/24/19.

## 2018-10-31 ENCOUNTER — Ambulatory Visit (INDEPENDENT_AMBULATORY_CARE_PROVIDER_SITE_OTHER): Payer: Medicare HMO | Admitting: Internal Medicine

## 2018-10-31 ENCOUNTER — Other Ambulatory Visit: Payer: Self-pay

## 2018-10-31 ENCOUNTER — Encounter: Payer: Self-pay | Admitting: Internal Medicine

## 2018-10-31 VITALS — BP 100/70 | HR 78 | Ht 67.0 in | Wt 148.0 lb

## 2018-10-31 DIAGNOSIS — R002 Palpitations: Secondary | ICD-10-CM

## 2018-10-31 DIAGNOSIS — I491 Atrial premature depolarization: Secondary | ICD-10-CM | POA: Diagnosis not present

## 2018-10-31 NOTE — Patient Instructions (Signed)
It appears you have PACs.  We are going to order a 24-hour Holter monitor and check labs including thyroid functions.  Further instructions to follow once we get results of 24-hour Holter monitor.  Watch caffeine intake and stay well-hydrated during hot weather.

## 2018-10-31 NOTE — Progress Notes (Signed)
   Subjective:    Patient ID: Samantha Byrd, female    DOB: 1945/07/21, 73 y.o.   MRN: [id number]  HPI 73 year old Female in today with complaint of intermittent fatigue and lightheadedness.  Does not like hot weather.  Tries to do yard work early in the morning when it is cool.  Also has noted some occasional palpitations.  At times feels a little short of breath.  No previous cardiac history.  Mother had history of hypothyroidism.  Patient thinks she may have a thyroid disorder.  Reminded her that TSH was normal at last physical exam.  We can certainly recheck this today.  Denies frank chest pain.    Review of Systems see above     Objective:   Physical Exam Blood pressure 100/70, pulse 78, pulse oximetry 97%.  BMI 23.18  Skin warm and dry.  Nodes none.  No thyromegaly.  No carotid bruits.  Chest clear to auscultation.  Cardiac exam reveals mostly regular rate and rhythm but occasional extrasystoles noted.  EKG shows occasional PACs.  No ST depression or elevation noted on EKG.  Labs drawn and pending.       Assessment & Plan:  Palpitations- premature atrial contractions could perhaps account for some of her symptoms.  She will have 24-hour Holter monitor.  Thyroid functions drawn.  CBC and basic metabolic panel drawn.  25 minutes spent with patient including discussion of thyroid disease and cardiac dysrhythmia.  We will be in touch with her after 24-hour Holter monitor results are known.  She takes very little medication and has history of intolerance to many medications.

## 2018-11-01 LAB — BASIC METABOLIC PANEL
BUN: 22 mg/dL (ref 7–25)
CO2: 28 mmol/L (ref 20–32)
Calcium: 10.4 mg/dL (ref 8.6–10.4)
Chloride: 102 mmol/L (ref 98–110)
Creat: 0.8 mg/dL (ref 0.60–0.93)
Glucose, Bld: 91 mg/dL (ref 65–99)
Potassium: 5 mmol/L (ref 3.5–5.3)
Sodium: 142 mmol/L (ref 135–146)

## 2018-11-01 LAB — CBC WITH DIFFERENTIAL/PLATELET
Absolute Monocytes: 481 cells/uL (ref 200–950)
Basophils Absolute: 33 cells/uL (ref 0–200)
Basophils Relative: 0.5 %
Eosinophils Absolute: 111 cells/uL (ref 15–500)
Eosinophils Relative: 1.7 %
HCT: 41.4 % (ref 35.0–45.0)
Hemoglobin: 14 g/dL (ref 11.7–15.5)
Lymphs Abs: 1749 cells/uL (ref 850–3900)
MCH: 28.6 pg (ref 27.0–33.0)
MCHC: 33.8 g/dL (ref 32.0–36.0)
MCV: 84.7 fL (ref 80.0–100.0)
MPV: 9.5 fL (ref 7.5–12.5)
Monocytes Relative: 7.4 %
Neutro Abs: 4128 cells/uL (ref 1500–7800)
Neutrophils Relative %: 63.5 %
Platelets: 288 10*3/uL (ref 140–400)
RBC: 4.89 10*6/uL (ref 3.80–5.10)
RDW: 13.3 % (ref 11.0–15.0)
Total Lymphocyte: 26.9 %
WBC: 6.5 10*3/uL (ref 3.8–10.8)

## 2018-11-01 LAB — TSH: TSH: 2.09 mIU/L (ref 0.40–4.50)

## 2018-11-01 LAB — T4, FREE: Free T4: 1.4 ng/dL (ref 0.8–1.8)

## 2018-11-21 ENCOUNTER — Telehealth: Payer: Self-pay | Admitting: *Deleted

## 2018-11-21 NOTE — Telephone Encounter (Signed)
3 day ZIO XT long term holter monitor to be mailed to patients home.  Instructions included in monitor kit.  Patient aware Dr. Renold Genta would like her to wear the monitor at least 24 hours.

## 2018-11-26 ENCOUNTER — Ambulatory Visit (INDEPENDENT_AMBULATORY_CARE_PROVIDER_SITE_OTHER): Payer: Medicare HMO

## 2018-11-26 DIAGNOSIS — R002 Palpitations: Secondary | ICD-10-CM | POA: Diagnosis not present

## 2018-12-07 DIAGNOSIS — R002 Palpitations: Secondary | ICD-10-CM | POA: Diagnosis not present

## 2019-03-24 ENCOUNTER — Other Ambulatory Visit: Payer: Medicare HMO | Admitting: Internal Medicine

## 2019-03-24 ENCOUNTER — Other Ambulatory Visit: Payer: Self-pay

## 2019-03-24 DIAGNOSIS — Z96649 Presence of unspecified artificial hip joint: Secondary | ICD-10-CM | POA: Diagnosis not present

## 2019-03-24 DIAGNOSIS — I491 Atrial premature depolarization: Secondary | ICD-10-CM | POA: Diagnosis not present

## 2019-03-24 DIAGNOSIS — M810 Age-related osteoporosis without current pathological fracture: Secondary | ICD-10-CM

## 2019-03-24 DIAGNOSIS — E7800 Pure hypercholesterolemia, unspecified: Secondary | ICD-10-CM

## 2019-03-24 DIAGNOSIS — R002 Palpitations: Secondary | ICD-10-CM | POA: Diagnosis not present

## 2019-03-24 DIAGNOSIS — Z0000 Encounter for general adult medical examination without abnormal findings: Secondary | ICD-10-CM

## 2019-03-25 LAB — COMPLETE METABOLIC PANEL WITH GFR
AG Ratio: 1.6 (calc) (ref 1.0–2.5)
ALT: 35 U/L — ABNORMAL HIGH (ref 6–29)
AST: 38 U/L — ABNORMAL HIGH (ref 10–35)
Albumin: 4.4 g/dL (ref 3.6–5.1)
Alkaline phosphatase (APISO): 108 U/L (ref 37–153)
BUN: 19 mg/dL (ref 7–25)
CO2: 26 mmol/L (ref 20–32)
Calcium: 9.9 mg/dL (ref 8.6–10.4)
Chloride: 103 mmol/L (ref 98–110)
Creat: 0.81 mg/dL (ref 0.60–0.93)
GFR, Est African American: 84 mL/min/{1.73_m2} (ref >=60)
GFR, Est Non African American: 72 mL/min/{1.73_m2} (ref >=60)
Globulin: 2.8 g/dL (calc) (ref 1.9–3.7)
Glucose, Bld: 85 mg/dL (ref 65–99)
Potassium: 4.7 mmol/L (ref 3.5–5.3)
Sodium: 142 mmol/L (ref 135–146)
Total Bilirubin: 0.4 mg/dL (ref 0.2–1.2)
Total Protein: 7.2 g/dL (ref 6.1–8.1)

## 2019-03-25 LAB — LIPID PANEL
Cholesterol: 307 mg/dL — ABNORMAL HIGH (ref <200)
HDL: 60 mg/dL (ref >=50)
LDL Cholesterol (Calc): 215 mg/dL (calc) — ABNORMAL HIGH
Non-HDL Cholesterol (Calc): 247 mg/dL (calc) — ABNORMAL HIGH (ref <130)
Total CHOL/HDL Ratio: 5.1 (calc) — ABNORMAL HIGH (ref <5.0)
Triglycerides: 155 mg/dL — ABNORMAL HIGH (ref <150)

## 2019-03-25 LAB — CBC WITH DIFFERENTIAL/PLATELET
Absolute Monocytes: 561 cells/uL (ref 200–950)
Basophils Absolute: 43 cells/uL (ref 0–200)
Basophils Relative: 0.7 %
Eosinophils Absolute: 128 cells/uL (ref 15–500)
Eosinophils Relative: 2.1 %
HCT: 42.1 % (ref 35.0–45.0)
Hemoglobin: 13.9 g/dL (ref 11.7–15.5)
Lymphs Abs: 2117 cells/uL (ref 850–3900)
MCH: 28.3 pg (ref 27.0–33.0)
MCHC: 33 g/dL (ref 32.0–36.0)
MCV: 85.6 fL (ref 80.0–100.0)
MPV: 9.4 fL (ref 7.5–12.5)
Monocytes Relative: 9.2 %
Neutro Abs: 3251 cells/uL (ref 1500–7800)
Neutrophils Relative %: 53.3 %
Platelets: 262 10*3/uL (ref 140–400)
RBC: 4.92 10*6/uL (ref 3.80–5.10)
RDW: 12.7 % (ref 11.0–15.0)
Total Lymphocyte: 34.7 %
WBC: 6.1 10*3/uL (ref 3.8–10.8)

## 2019-03-25 LAB — TSH: TSH: 2.27 mIU/L (ref 0.40–4.50)

## 2019-03-27 ENCOUNTER — Other Ambulatory Visit: Payer: Self-pay

## 2019-03-27 ENCOUNTER — Encounter: Payer: Self-pay | Admitting: Internal Medicine

## 2019-03-27 ENCOUNTER — Ambulatory Visit (INDEPENDENT_AMBULATORY_CARE_PROVIDER_SITE_OTHER): Payer: Medicare HMO | Admitting: Internal Medicine

## 2019-03-27 VITALS — BP 110/70 | HR 70 | Temp 98.0°F | Ht 67.0 in | Wt 153.0 lb

## 2019-03-27 DIAGNOSIS — M8580 Other specified disorders of bone density and structure, unspecified site: Secondary | ICD-10-CM

## 2019-03-27 DIAGNOSIS — Z8781 Personal history of (healed) traumatic fracture: Secondary | ICD-10-CM

## 2019-03-27 DIAGNOSIS — I471 Supraventricular tachycardia: Secondary | ICD-10-CM | POA: Diagnosis not present

## 2019-03-27 DIAGNOSIS — E7800 Pure hypercholesterolemia, unspecified: Secondary | ICD-10-CM

## 2019-03-27 DIAGNOSIS — Z0000 Encounter for general adult medical examination without abnormal findings: Secondary | ICD-10-CM

## 2019-03-27 LAB — POCT URINALYSIS DIPSTICK
Appearance: NEGATIVE
Bilirubin, UA: NEGATIVE
Blood, UA: NEGATIVE
Glucose, UA: NEGATIVE
Ketones, UA: NEGATIVE
Leukocytes, UA: NEGATIVE
Nitrite, UA: NEGATIVE
Odor: NEGATIVE
Protein, UA: NEGATIVE
Spec Grav, UA: 1.01 (ref 1.010–1.025)
Urobilinogen, UA: 0.2 E.U./dL
pH, UA: 6.5 (ref 5.0–8.0)

## 2019-03-27 NOTE — Progress Notes (Addendum)
Subjective:    Patient ID: Samantha Byrd, female    DOB: 1945/05/01, 73 y.o.   MRN: [id number]  HPI 73 year old Female for Medicare wellness, health maintenance exam and evaluation of medical issues.  Has taken strontium for osteopenia and does not want to be on bisphosphonate, Reclast or Boniva.  Last bone density study was in 2019 with T score -2.1 in the right femoral neck and -1.4 in AP spine.  AP spine improved over 2 years from -2.3 to -1.4 but right femoral neck score remained at -2.1.  History of comminuted hip fracture 2015 repaired by Dr. Nolon Bussing take at metabolic prep.  Hospital in [place].  She took Fosamax between 2001 and 2011 for history of osteopenia.  Has had right patellar dislocation.  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  Had sigmoidoscopy by Dr. Cristina Gong 1998.  Has refused colonoscopy.  Refuses mammogram.  Offered Cologuard.  Last mammogram on file 2011.  No known drug allergies.  She saw an endocrinologist in 2007 regarding treatment for osteoporosis.  He recommended changing Fosamax to Reclast if there should be further decline in bone density study however she has not wanted to do this.  She has seen physicians for holistic medicine treatment for her knees.  History of right knee arthroscopic surgery for torn cartilage 1997.  History of arthroscopic knee surgery left knee for torn cartilage in 2002.  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  One child.  Family history: Father died from complications of hip fracture.  Apparently hip could not be repaired and he went home to pass away.  He was in his 46s.  1 son in good health.    Review of Systems History of palpitations.  Had Holter monitor in August 2020 showing sinus bradycardia to sinus tachycardia and 2 very short episodes of SVT.  This does not last for very long and she does not want to take medication.       Objective:   Physical Exam        Assessment & Plan:  Health maintenance- Cologard declined- Has never had colonoscopy and has declined.  Declines mammogram  Flu vaccine decline  Pneumonia vaccines declined  Osteoporosis-declines prescription treatment  Hyperlipidemia-declines medication  History of left hip fracture  Plan: Return in 1 year or as needed.  Continue to social distance until COVID-19 vaccine widely available.  Subjective:   Patient presents for Medicare Annual/Subsequent preventive examination.  Review Past Medical/Family/Social: See above   Risk Factors  Current exercise habits: Active about her home Dietary issues discussed: Low-fat low carbohydrate recommended  Cardiac risk factors: Hyperlipidemia  Depression Screen  (Note: if answer to either of the following is "Yes", a more complete depression screening is indicated)   Over the past two weeks, have you felt down, depressed or hopeless? No  Over the past two weeks, have you felt little interest or pleasure in doing things? No Have you lost interest or pleasure in daily life? No Do you often feel hopeless? No Do you cry easily over simple problems? No   Activities of Daily Living  In your present state of health, do you have any difficulty performing the following activities?:   Driving?  Not driving Managing money? No  Feeding yourself? No  Getting from bed to chair? No  Climbing a flight of stairs?  Hard to do because of knee pain but I can  do it slowly Preparing food and eating?: No  Bathing or showering? No  Getting dressed: No  Getting to the toilet? No  Using the toilet:No  Moving around from place to place: No  In the past year have you fallen or had a near fall?:  Yes I have fallen 3 times but no injury Are you sexually active? No  Do you have more than one partner? No   Hearing Difficulties: No  Do you often ask people to speak up or repeat themselves?  Sometimes I have a  hearing aid Do you experience ringing or noises in your ears? No  Do you have difficulty understanding soft or whispered voices?  Sometimes Do you feel that you have a problem with memory?  Sometimes cannot remember names Do you often misplace items? No    Home Safety:  Do you have a smoke alarm at your residence? Yes Do you have grab bars in the bathroom?  Yes Do you have throw rugs in your house?  Yes   Cognitive Testing  Alert? Yes Normal Appearance?Yes  Oriented to person? Yes Place? Yes  Time? Yes  Recall of three objects? Yes  Can perform simple calculations? Yes  Displays appropriate judgment?Yes  Can read the correct time from a watch face?Yes   List the Names of Other Physician/Practitioners you currently use:  See referral list for the physicians patient is currently seeing.     Review of Systems: See above   Objective:     General appearance: Appears stated age and mildly obese  Head: Normocephalic, without obvious abnormality, atraumatic  Eyes: conj clear, EOMi PEERLA  Ears: normal TM's and external ear canals both ears  Nose: Nares normal. Septum midline. Mucosa normal. No drainage or sinus tenderness.  Throat: lips, mucosa, and tongue normal; teeth and gums normal  Neck: no adenopathy, no carotid bruit, no JVD, supple, symmetrical, trachea midline and thyroid not enlarged, symmetric, no tenderness/mass/nodules  No CVA tenderness.  Lungs: clear to auscultation bilaterally  Breasts: normal appearance, no masses or tenderness Heart: regular rate and rhythm, S1, S2 normal, no murmur, click, rub or gallop  Abdomen: soft, non-tender; bowel sounds normal; no masses, no organomegaly  Musculoskeletal: ROM normal in all joints, no crepitus, no deformity, Normal muscle strengthen. Back  is symmetric, no curvature. Skin: Skin color, texture, turgor normal. No rashes or lesions  Lymph nodes: Cervical, supraclavicular, and axillary nodes normal.  Neurologic: CN 2 -12  Normal, Normal symmetric reflexes. Normal coordination and gait  Psych: Alert & Oriented x 3, Mood appear stable.    Assessment:    Annual wellness medicare exam   Plan:    During the course of the visit the patient was educated and counseled about appropriate screening and preventive services including:   Declines flu vaccine     Patient Instructions (the written plan) was given to the patient.  Medicare Attestation  I have personally reviewed:  The patient's medical and social history  Their use of alcohol, tobacco or illicit drugs  Their current medications and supplements  The patient's functional ability including ADLs,fall risks, home safety risks, cognitive, and hearing and visual impairment  Diet and physical activities  Evidence for depression or mood disorders  The patient's weight, height, BMI, and visual acuity have been recorded in the chart. I have made referrals, counseling, and provided education to the patient based on review of the above and I have provided the patient with a written personalized care plan for preventive  services.

## 2019-03-29 DIAGNOSIS — R69 Illness, unspecified: Secondary | ICD-10-CM | POA: Diagnosis not present

## 2019-04-16 NOTE — Patient Instructions (Signed)
It was a pleasure to see you today.  Continue to be as active as possible and follow-up in 1 year or as needed.  Many health maintenance measures were declined by patient.

## 2019-06-08 ENCOUNTER — Ambulatory Visit: Payer: Medicare HMO | Attending: Internal Medicine

## 2019-06-08 DIAGNOSIS — Z23 Encounter for immunization: Secondary | ICD-10-CM

## 2019-06-08 NOTE — Progress Notes (Signed)
   Covid-19 Vaccination Clinic  Name:  SHENELL LEITH    MRN: [id number] DOB: 10-Jul-1945  06/08/2019  Ms. Defrancesco was observed post Covid-19 immunization for 15 minutes without incidence. She was provided with Vaccine Information Sheet and instruction to access the V-Safe system.   Ms. Schiefer was instructed to call 911 with any severe reactions post vaccine: Marland Kitchen Difficulty breathing  . Swelling of your face and throat  . A fast heartbeat  . A bad rash all over your body  . Dizziness and weakness    Immunizations Administered    Name Date Dose VIS Date Route   Pfizer COVID-19 Vaccine 06/08/2019  4:04 PM 0.3 mL 03/24/2019 Intramuscular   Manufacturer: [place]   Lot: [id number]   [place]: [id number]

## 2019-07-05 ENCOUNTER — Ambulatory Visit: Payer: Medicare HMO | Attending: Internal Medicine

## 2019-07-05 DIAGNOSIS — Z23 Encounter for immunization: Secondary | ICD-10-CM

## 2019-07-05 NOTE — Progress Notes (Signed)
   Covid-19 Vaccination Clinic  Name:  Samantha Byrd    MRN: [id number] DOB: 1945-06-02  07/05/2019  Samantha Byrd was observed post Covid-19 immunization for 15 minutes without incident. She was provided with Vaccine Information Sheet and instruction to access the V-Safe system.   Samantha Byrd was instructed to call 911 with any severe reactions post vaccine: Marland Kitchen Difficulty breathing  . Swelling of face and throat  . A fast heartbeat  . A bad rash all over body  . Dizziness and weakness   Immunizations Administered    Name Date Dose VIS Date Route   Pfizer COVID-19 Vaccine 07/05/2019 10:37 AM 0.3 mL 03/24/2019 Intramuscular   Manufacturer: [place]   Lot: [id number]   [place]: [id number]

## 2019-07-21 ENCOUNTER — Other Ambulatory Visit: Payer: Self-pay | Admitting: Internal Medicine

## 2019-07-21 DIAGNOSIS — Z1231 Encounter for screening mammogram for malignant neoplasm of breast: Secondary | ICD-10-CM

## 2020-03-26 ENCOUNTER — Other Ambulatory Visit: Payer: Medicare HMO | Admitting: Internal Medicine

## 2020-03-26 ENCOUNTER — Other Ambulatory Visit: Payer: Self-pay

## 2020-03-26 DIAGNOSIS — E785 Hyperlipidemia, unspecified: Secondary | ICD-10-CM | POA: Diagnosis not present

## 2020-03-26 DIAGNOSIS — Z8781 Personal history of (healed) traumatic fracture: Secondary | ICD-10-CM | POA: Diagnosis not present

## 2020-03-26 DIAGNOSIS — S7292XS Unspecified fracture of left femur, sequela: Secondary | ICD-10-CM | POA: Diagnosis not present

## 2020-03-26 DIAGNOSIS — M8580 Other specified disorders of bone density and structure, unspecified site: Secondary | ICD-10-CM | POA: Diagnosis not present

## 2020-03-26 DIAGNOSIS — Z0000 Encounter for general adult medical examination without abnormal findings: Secondary | ICD-10-CM | POA: Diagnosis not present

## 2020-03-26 DIAGNOSIS — R739 Hyperglycemia, unspecified: Secondary | ICD-10-CM

## 2020-03-26 DIAGNOSIS — E782 Mixed hyperlipidemia: Secondary | ICD-10-CM | POA: Diagnosis not present

## 2020-03-26 DIAGNOSIS — E7800 Pure hypercholesterolemia, unspecified: Secondary | ICD-10-CM

## 2020-03-27 LAB — CBC WITH DIFFERENTIAL/PLATELET
Absolute Monocytes: 561 cells/uL (ref 200–950)
Basophils Absolute: 43 cells/uL (ref 0–200)
Basophils Relative: 0.6 %
Eosinophils Absolute: 99 cells/uL (ref 15–500)
Eosinophils Relative: 1.4 %
HCT: 44.1 % (ref 35.0–45.0)
Hemoglobin: 14.9 g/dL (ref 11.7–15.5)
Lymphs Abs: 2109 cells/uL (ref 850–3900)
MCH: 29.4 pg (ref 27.0–33.0)
MCHC: 33.8 g/dL (ref 32.0–36.0)
MCV: 87.2 fL (ref 80.0–100.0)
MPV: 9.8 fL (ref 7.5–12.5)
Monocytes Relative: 7.9 %
Neutro Abs: 4288 cells/uL (ref 1500–7800)
Neutrophils Relative %: 60.4 %
Platelets: 282 10*3/uL (ref 140–400)
RBC: 5.06 10*6/uL (ref 3.80–5.10)
RDW: 12.7 % (ref 11.0–15.0)
Total Lymphocyte: 29.7 %
WBC: 7.1 10*3/uL (ref 3.8–10.8)

## 2020-03-27 LAB — LIPID PANEL
Cholesterol: 267 mg/dL — ABNORMAL HIGH (ref <200)
HDL: 73 mg/dL (ref >=50)
LDL Cholesterol (Calc): 172 mg/dL (calc) — ABNORMAL HIGH
Non-HDL Cholesterol (Calc): 194 mg/dL (calc) — ABNORMAL HIGH (ref <130)
Total CHOL/HDL Ratio: 3.7 (calc) (ref <5.0)
Triglycerides: 98 mg/dL (ref <150)

## 2020-03-27 LAB — COMPLETE METABOLIC PANEL WITH GFR
AG Ratio: 1.8 (calc) (ref 1.0–2.5)
ALT: 19 U/L (ref 6–29)
AST: 26 U/L (ref 10–35)
Albumin: 4.7 g/dL (ref 3.6–5.1)
Alkaline phosphatase (APISO): 106 U/L (ref 37–153)
BUN: 16 mg/dL (ref 7–25)
CO2: 29 mmol/L (ref 20–32)
Calcium: 10.2 mg/dL (ref 8.6–10.4)
Chloride: 101 mmol/L (ref 98–110)
Creat: 0.75 mg/dL (ref 0.60–0.93)
GFR, Est African American: 91 mL/min/{1.73_m2} (ref >=60)
GFR, Est Non African American: 79 mL/min/{1.73_m2} (ref >=60)
Globulin: 2.6 g/dL (calc) (ref 1.9–3.7)
Glucose, Bld: 81 mg/dL (ref 65–99)
Potassium: 4.5 mmol/L (ref 3.5–5.3)
Sodium: 139 mmol/L (ref 135–146)
Total Bilirubin: 0.7 mg/dL (ref 0.2–1.2)
Total Protein: 7.3 g/dL (ref 6.1–8.1)

## 2020-03-27 LAB — HEMOGLOBIN A1C
Hgb A1c MFr Bld: 5.6 % of total Hgb (ref <5.7)
Mean Plasma Glucose: 114 mg/dL
eAG (mmol/L): 6.3 mmol/L

## 2020-03-27 LAB — TSH: TSH: 2.53 mIU/L (ref 0.40–4.50)

## 2020-03-27 LAB — VITAMIN D 25 HYDROXY (VIT D DEFICIENCY, FRACTURES): Vit D, 25-Hydroxy: 92 ng/mL (ref 30–100)

## 2020-03-28 ENCOUNTER — Ambulatory Visit (INDEPENDENT_AMBULATORY_CARE_PROVIDER_SITE_OTHER): Payer: Medicare HMO | Admitting: Internal Medicine

## 2020-03-28 ENCOUNTER — Encounter: Payer: Self-pay | Admitting: Internal Medicine

## 2020-03-28 ENCOUNTER — Other Ambulatory Visit: Payer: Self-pay

## 2020-03-28 VITALS — BP 116/64 | HR 64 | Ht 67.0 in | Wt 134.0 lb

## 2020-03-28 DIAGNOSIS — Z8781 Personal history of (healed) traumatic fracture: Secondary | ICD-10-CM

## 2020-03-28 DIAGNOSIS — M8580 Other specified disorders of bone density and structure, unspecified site: Secondary | ICD-10-CM

## 2020-03-28 DIAGNOSIS — Z0000 Encounter for general adult medical examination without abnormal findings: Secondary | ICD-10-CM | POA: Diagnosis not present

## 2020-03-28 DIAGNOSIS — E782 Mixed hyperlipidemia: Secondary | ICD-10-CM | POA: Diagnosis not present

## 2020-03-28 LAB — POCT URINALYSIS DIPSTICK
Bilirubin, UA: NEGATIVE
Blood, UA: NEGATIVE
Glucose, UA: NEGATIVE
Ketones, UA: NEGATIVE
Leukocytes, UA: NEGATIVE
Nitrite, UA: NEGATIVE
Protein, UA: NEGATIVE
Spec Grav, UA: 1.01 (ref 1.010–1.025)
Urobilinogen, UA: 0.2 E.U./dL
pH, UA: 6 (ref 5.0–8.0)

## 2020-03-28 NOTE — Progress Notes (Signed)
Subjective:    Patient ID: Samantha Byrd, female    DOB: [birth year], 74 y.o.   MRN: [id number]  HPI 74 year old Female for Medicare wellness, health maintenance exam and evaluation of medical issues. Has been on low fat diet. Triglycerides have normalized. Total and LDL cholesterol have improved and were discussed with her today in detail. Cholesterol is decreased from 307 to 267.  Triglycerides have decreased from 155 to 98.   HDL has increased from 60 to 73.  LDL has increased from 215 to 172.  Bone density order placed.  Last bone density was in 2019 with lowest T score -2.3 in the lumbar spine.  She takes strontium for osteopenia.  Has not wanted to be on prescription medication.  History of comminuted hip fracture in 2015 repaired by orthopedist in [place] for accident occurred.  She took Fosamax between 2001 and 2011 for osteopenia.  History of right patellar dislocation in the remote past.  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  Had sigmoidoscopy by Dr. Cristina Gong in 1998.  Has refused colonoscopy.  Refuses mammogram.  Cologuard has been offered.  Have ordered mammogram.  No known drug allergies.  Saw endocrinologist in 2007 regarding treatment for osteoporosis.  He recommended changing Fosamax to Reclast if there should be further decline in bone density but she has not wanted to do this.  She has seen physicians for holistic medicine treatment for her knees.  History of right knee arthroscopic surgery for torn cartilage in 1997.  History of arthroscopic knee surgery left knee for torn cartilage in 2002.  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  One child.  Family history: Father died from  complications of hip fracture.  Apparently hip could not be repaired and he went home to passively.  He was in his 47s.  Son has history of hyperlipidemia but does not follow a low-fat  diet.  Review of systems: Patient had palpitations in 2020.  She had Holter monitor showing sinus bradycardia to sinus tachycardia into very short episodes of SVT.  This apparently has resolved.     Review of Systems sometimes has nocturnal leg cramps.  We talked about taking tonic water.  She does not want to take prescription medication.     Objective:   Physical Exam Blood pressure 116/64 pulse 64 pulse oximetry 97% weight 134 pounds height 5 feet 7 inches BMI 20.99.  Weight last year was 153 pounds but she has purposely changed her diet and this reflects her 19 pound weight loss.  Otherwise she feels well with no complaints.  Skin warm and dry.  No cervical adenopathy.  No carotid bruits.  Chest clear to auscultation.  Breast without masses.  Cardiac exam regular rate and rhythm normal S1 and S2 without murmurs or gallops.  Abdomen soft nondistended without hepatosplenomegaly masses or tenderness.  Bimanual exam is normal.  Pap not done due to age.  No lower extremity pitting edema.  Neuro is intact without focal deficits.  Affect thought and judgment are normal.       Assessment & Plan:  Mixed hyperlipidemia-significant improvement with changing diet to low-fat diet  History of osteopenia-bone density study ordered  Remote history of left hip fracture  Health maintenance-declines flu vaccine.  Mammogram ordered.  Declines pneumococcal vaccines.  Is considering Covid vaccine booster.  Has had 2 previous doses.  Tetanus immunization is up-to-date until 2023.  Have ordered Cologuard for her to consider screening for colon cancer.  Plan: Return in 1 year or as needed.  Subjective:   Patient presents for Medicare Annual/Subsequent preventive examination.  Review Past Medical/Family/Social: See above  Risk Factors  Current exercise habits: Very active Dietary issues discussed: Following low-fat diet  Cardiac risk factors: Hyperlipidemia  Depression Screen  (Note: if answer  to either of the following is "Yes", a more complete depression screening is indicated)   Over the past two weeks, have you felt down, depressed or hopeless? No  Over the past two weeks, have you felt little interest or pleasure in doing things? No Have you lost interest or pleasure in daily life? No Do you often feel hopeless? No Do you cry easily over simple problems? No   Activities of Daily Living  In your present state of health, do you have any difficulty performing the following activities?:   Driving? No  Managing money? No  Feeding yourself? No  Getting from bed to chair? No  Climbing a flight of stairs? No  Preparing food and eating?: No  Bathing or showering? No  Getting dressed: No  Getting to the toilet? No  Using the toilet:No  Moving around from place to place: No  In the past year have you fallen or had a near fall?:No  Are you sexually active? No  Do you have more than one partner? No   Hearing Difficulties: No  Do you often ask people to speak up or repeat themselves? No  Do you experience ringing or noises in your ears? No  Do you have difficulty understanding soft or whispered voices? No  Do you feel that you have a problem with memory? No Do you often misplace items? No    Home Safety:  Do you have a smoke alarm at your residence? Yes Do you have grab bars in the bathroom?  Yes Do you have throw rugs in your house?  Yes   Cognitive Testing  Alert? Yes Normal Appearance?Yes  Oriented to person? Yes Place? Yes  Time? Yes  Recall of three objects? Yes  Can perform simple calculations? Yes  Displays appropriate judgment?Yes  Can read the correct time from a watch face?Yes   List the Names of Other Physician/Practitioners you currently use:  See referral list for the physicians patient is currently seeing.    Review of Systems: See above  Objective:     General appearance: Appears younger than stated age and trim Head: Normocephalic, without  obvious abnormality, atraumatic  Eyes: conj clear, EOMi PEERLA  Ears: normal TM's and external ear canals both ears  Nose: Nares normal. Septum midline. Mucosa normal. No drainage or sinus tenderness.  Throat: lips, mucosa, and tongue normal; teeth and gums normal  Neck: no adenopathy, no carotid bruit, no JVD, supple, symmetrical, trachea midline and thyroid not enlarged, symmetric, no tenderness/mass/nodules  No CVA tenderness.  Lungs: clear to auscultation bilaterally  Breasts: normal appearance, no masses or tenderness, top of the pacemaker on left upper chest. Incision well-healed. It is tender.  Heart: regular rate and rhythm, S1, S2 normal, no murmur, click, rub or gallop  Abdomen: soft, non-tender; bowel sounds normal; no masses, no organomegaly  Musculoskeletal: ROM normal in all joints, no crepitus, no deformity, Normal muscle strengthen. Back  is symmetric, no curvature. Skin: Skin color, texture, turgor normal. No rashes or lesions  Lymph nodes: Cervical, supraclavicular, and axillary nodes normal.  Neurologic: CN 2 -12 Normal, Normal symmetric reflexes.  Normal coordination and gait  Psych: Alert & Oriented x 3, Mood appear stable.    Assessment:    Annual wellness medicare exam   Plan:    During the course of the visit the patient was educated and counseled about appropriate screening and preventive services including:   Mammogram  Bone density  Advised third Covid immunization  Patient declines flu vaccine     Patient Instructions (the written plan) was given to the patient.  Medicare Attestation  I have personally reviewed:  The patient's medical and social history  Their use of alcohol, tobacco or illicit drugs  Their current medications and supplements  The patient's functional ability including ADLs,fall risks, home safety risks, cognitive, and hearing and visual impairment  Diet and physical activities  Evidence for depression or mood disorders  The  patient's weight, height, BMI, and visual acuity have been recorded in the chart. I have made referrals, counseling, and provided education to the patient based on review of the above and I have provided the patient with a written personalized care plan for preventive services.

## 2020-03-28 NOTE — Patient Instructions (Addendum)
It was a pleasure to see you today.  Lipids have improved significantly with low-fat diet.  Please continue this.  Advise third Covid immunization.  I have placed order for mammogram and bone density study.  Return in 1 year or as needed.

## 2020-04-03 DIAGNOSIS — R69 Illness, unspecified: Secondary | ICD-10-CM | POA: Diagnosis not present

## 2020-06-28 ENCOUNTER — Other Ambulatory Visit: Payer: Medicare HMO

## 2021-03-24 ENCOUNTER — Other Ambulatory Visit: Payer: Self-pay | Admitting: Internal Medicine

## 2021-03-24 DIAGNOSIS — M8580 Other specified disorders of bone density and structure, unspecified site: Secondary | ICD-10-CM

## 2021-03-28 ENCOUNTER — Other Ambulatory Visit: Payer: Medicare HMO | Admitting: Internal Medicine

## 2021-03-28 ENCOUNTER — Other Ambulatory Visit: Payer: Self-pay

## 2021-03-28 DIAGNOSIS — Z1329 Encounter for screening for other suspected endocrine disorder: Secondary | ICD-10-CM

## 2021-03-28 DIAGNOSIS — E782 Mixed hyperlipidemia: Secondary | ICD-10-CM | POA: Diagnosis not present

## 2021-03-28 DIAGNOSIS — Z0000 Encounter for general adult medical examination without abnormal findings: Secondary | ICD-10-CM | POA: Diagnosis not present

## 2021-03-29 LAB — LIPID PANEL
Cholesterol: 347 mg/dL — ABNORMAL HIGH (ref <200)
HDL: 66 mg/dL (ref >=50)
LDL Cholesterol (Calc): 248 mg/dL (calc) — ABNORMAL HIGH
Non-HDL Cholesterol (Calc): 281 mg/dL (calc) — ABNORMAL HIGH (ref <130)
Total CHOL/HDL Ratio: 5.3 (calc) — ABNORMAL HIGH (ref <5.0)
Triglycerides: 162 mg/dL — ABNORMAL HIGH (ref <150)

## 2021-03-29 LAB — COMPLETE METABOLIC PANEL WITH GFR
AG Ratio: 1.6 (calc) (ref 1.0–2.5)
ALT: 19 U/L (ref 6–29)
AST: 22 U/L (ref 10–35)
Albumin: 4.7 g/dL (ref 3.6–5.1)
Alkaline phosphatase (APISO): 102 U/L (ref 37–153)
BUN: 19 mg/dL (ref 7–25)
CO2: 27 mmol/L (ref 20–32)
Calcium: 9.8 mg/dL (ref 8.6–10.4)
Chloride: 101 mmol/L (ref 98–110)
Creat: 0.72 mg/dL (ref 0.60–1.00)
Globulin: 3 g/dL (calc) (ref 1.9–3.7)
Glucose, Bld: 84 mg/dL (ref 65–99)
Potassium: 4.9 mmol/L (ref 3.5–5.3)
Sodium: 140 mmol/L (ref 135–146)
Total Bilirubin: 0.5 mg/dL (ref 0.2–1.2)
Total Protein: 7.7 g/dL (ref 6.1–8.1)
eGFR: 87 mL/min/{1.73_m2} (ref >=60)

## 2021-03-29 LAB — CBC WITH DIFFERENTIAL/PLATELET
Absolute Monocytes: 516 cells/uL (ref 200–950)
Basophils Absolute: 40 cells/uL (ref 0–200)
Basophils Relative: 0.6 %
Eosinophils Absolute: 107 cells/uL (ref 15–500)
Eosinophils Relative: 1.6 %
HCT: 43.7 % (ref 35.0–45.0)
Hemoglobin: 14.7 g/dL (ref 11.7–15.5)
Lymphs Abs: 2117 cells/uL (ref 850–3900)
MCH: 29.2 pg (ref 27.0–33.0)
MCHC: 33.6 g/dL (ref 32.0–36.0)
MCV: 86.7 fL (ref 80.0–100.0)
MPV: 9.3 fL (ref 7.5–12.5)
Monocytes Relative: 7.7 %
Neutro Abs: 3920 cells/uL (ref 1500–7800)
Neutrophils Relative %: 58.5 %
Platelets: 289 10*3/uL (ref 140–400)
RBC: 5.04 10*6/uL (ref 3.80–5.10)
RDW: 12.9 % (ref 11.0–15.0)
Total Lymphocyte: 31.6 %
WBC: 6.7 10*3/uL (ref 3.8–10.8)

## 2021-03-29 LAB — TSH: TSH: 2.87 mIU/L (ref 0.40–4.50)

## 2021-03-31 ENCOUNTER — Encounter: Payer: Self-pay | Admitting: Internal Medicine

## 2021-03-31 ENCOUNTER — Other Ambulatory Visit: Payer: Self-pay

## 2021-03-31 ENCOUNTER — Ambulatory Visit (INDEPENDENT_AMBULATORY_CARE_PROVIDER_SITE_OTHER): Payer: Medicare HMO | Admitting: Internal Medicine

## 2021-03-31 VITALS — BP 118/74 | HR 82 | Temp 98.2°F | Ht 67.0 in | Wt 147.0 lb

## 2021-03-31 DIAGNOSIS — E782 Mixed hyperlipidemia: Secondary | ICD-10-CM | POA: Diagnosis not present

## 2021-03-31 DIAGNOSIS — R8290 Unspecified abnormal findings in urine: Secondary | ICD-10-CM

## 2021-03-31 DIAGNOSIS — Z8781 Personal history of (healed) traumatic fracture: Secondary | ICD-10-CM

## 2021-03-31 DIAGNOSIS — Z0000 Encounter for general adult medical examination without abnormal findings: Secondary | ICD-10-CM | POA: Diagnosis not present

## 2021-03-31 DIAGNOSIS — M8580 Other specified disorders of bone density and structure, unspecified site: Secondary | ICD-10-CM

## 2021-03-31 LAB — POCT URINALYSIS DIPSTICK
Bilirubin, UA: NEGATIVE
Glucose, UA: NEGATIVE
Nitrite, UA: NEGATIVE
Protein, UA: NEGATIVE
Spec Grav, UA: >=1.03 — AB (ref 1.010–1.025)
Urobilinogen, UA: NEGATIVE E.U./dL — AB
pH, UA: 6.5 (ref 5.0–8.0)

## 2021-03-31 NOTE — Patient Instructions (Addendum)
It was a pleasure to see you today. We discussed cholesterol management and bone density. Patient does not want to be on lipid lowering medication and will not take prescription bone sparing medication. Tetanus vaccine due next year. Declines Covid booster and other vaccines. Labs are stable. Cholesterol is elevated. Discussion about watching diet. Does not want Cologard, mammogram or hemoccult card.

## 2021-03-31 NOTE — Progress Notes (Signed)
Annual Wellness Visit     Patient: Samantha Byrd, Female    DOB: 03-25-1946, 75 y.o.   MRN: [id number] Visit Date: 03/31/2021  Chief Complaint  Patient presents with   Medicare Wellness   Subjective    Samantha Byrd is a 75 y.o. female who presents today for her Annual Wellness Visit.  HPI 75 year old also presents for health maintenance exam and evaluation of medical issues by physician.  Patient tells me she and her husband have decided to move to [place] to a retirement facility within the next year.  Their son lives in [place] which is not too far away from this center.  They are looking for less responsibility with house upkeep and looking to meet new people and participate in activities there such as golf and reading books.  She has pure hypercholesterolemia with total cholesterol 347, HDL 66, triglycerides mildly elevated at 162 and LDL cholesterol of 248.  Has not wanted to be on lipid-lowering medication.  A year ago total cholesterol was 267, HDL 73, triglycerides 98 and LDL cholesterol 172.  History of osteopenia.  Used to take strontium for osteopenia currently not on anything and does not want to be on prescription medication.  Last bone density study in 2019 showed T score -2.3.  Bone density study ordered for the near future.  History of comminuted hip fracture in 2015 repaired by orthopedist in [place] where the accident occurred.  She took Fosamax between 2001 and 2011 for osteopenia.  History of right patellar dislocation in the remote past.  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  Patient had sigmoidoscopy by Dr. Cristina Gong in 1998.  Has refused colonoscopy.  Refuses mammogram.  Cologuard has been offered but patient declined this as well.  Saw endocrinologist in 2007 for treatment of osteoporosis.  He recommended changing Fosamax to Reclast if there was further decline in bone density but she has not wanted to do this.  She sees  physicians for holistic medicine treatment at times for her knees.  History of right knee arthroscopic surgery for torn cartilage in 1997.  History of arthroscopic knee surgery left knee for torn cartilage in 2002.  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  She is a self-employed bookkeeper.  Does not smoke or consume alcohol.  1 son.  Family history: Father died from complications of hip fracture.  Apparently hip could not be repaired and he went home and passed away.  He was in his 71s.  Son has history of hyperlipidemia but does not follow a low-fat diet she says.  Review of systems: Patient had palpitations in 2020.  She had Holter monitor showing sinus bradycardia to sinus tachycardia into very short episodes of SVT.  This apparently has resolved.  Remote history of nocturnal leg cramps.   Social History   Social History Narrative   Not on file  Retired [employer].  1 adult son who lives in [place].  Married.  Patient Care Team: Elby Showers, MD as PCP - General (Internal Medicine)  Review of Systems no new complaints   Objective    Vitals: Ht 5\' 7"  (1.702 m)    Wt 147 lb (66.7 kg)    BMI 23.02 kg/m  Blood pressure 119/74 pulse 82 pulse oximetry 99% on room air.  Temperature 98.2 degrees weight 147 pounds height 5 feet 7 inches BMI 23.02 Physical Exam Skin warm and dry.  Nodes none.  No  JVD thyromegaly or carotid bruits.  Chest clear to auscultation.  Cardiac exam: Regular rate and rhythm without ectopy.  Abdomen soft nondistended without hepatosplenomegaly masses or tenderness.  Bimanual normal.  No lower extremity edema.  Neurological exam is intact without focal deficits.  Affect thought and judgment are normal.  Most recent functional status assessment: In your present state of health, do you have any difficulty performing the following activities: 03/31/2021  Hearing? N  Vision? N  Difficulty concentrating or making decisions? N  Walking or  climbing stairs? Y  Dressing or bathing? N  Doing errands, shopping? N  Preparing Food and eating ? N  Using the Toilet? N  In the past six months, have you accidently leaked urine? N  Do you have problems with loss of bowel control? N  Managing your Medications? N  Managing your Finances? N  Housekeeping or managing your Housekeeping? N  Some recent data might be hidden   Most recent fall risk assessment: Fall Risk  03/31/2021  Falls in the past year? 0  Number falls in past yr: 0  Injury with Fall? 0  Comment -  Risk for fall due to : No Fall Risks  Follow up Falls evaluation completed    Most recent depression screenings: PHQ 2/9 Scores 03/31/2021 03/27/2019  PHQ - 2 Score 0 0   Most recent cognitive screening: 6CIT Screen 03/31/2021  What Year? 0 points  What month? 0 points  What time? 0 points  Count back from 20 0 points  Months in reverse 0 points  Repeat phrase 0 points  Total Score 0  Cognitive screen and is within normal limits     Assessment & Plan   History of osteopenia and osteoporosis-doe- last T score on bone density study in 2019 was -2.1  History of right patellar dislocation in the remote past  Declines colonoscopy or Cologuard  History of hip fracture 2015 as a result of an accident  Mixed hyperlipidemia-does not want to be on medication  Plan: Patient will be moving to a retirement facility in [place] with her husband sometime in 2023.  It has been a pleasure to take care of of patient and her husband as they are delightful.    Annual wellness visit done today including the all of the following: Reviewed patient's Family Medical History Reviewed and updated list of patient's medical providers Assessment of cognitive impairment was done Assessed patient's functional ability Established a written schedule for health screening [place] Completed and Reviewed  Discussed health benefits of physical activity, and  encouraged her to engage in regular exercise appropriate for her age and condition.         {I, Elby Showers, MD, have reviewed all documentation for this visit. The documentation on 04/10/21 for the exam, diagnosis, procedures, and orders are all accurate and complete.   Angus Seller, CMA

## 2021-04-01 LAB — URINE CULTURE
MICRO NUMBER:: 12773950
Result:: NO GROWTH
SPECIMEN QUALITY:: ADEQUATE

## 2021-04-10 ENCOUNTER — Encounter: Payer: Self-pay | Admitting: Internal Medicine

## 2021-04-15 ENCOUNTER — Telehealth: Payer: Self-pay

## 2021-04-15 ENCOUNTER — Other Ambulatory Visit: Payer: Self-pay

## 2021-04-15 MED ORDER — ATORVASTATIN CALCIUM 10 MG PO TABS: 10.0000 mg | ORAL_TABLET | Freq: Every day | ORAL | 0 refills | Status: AC

## 2021-04-15 NOTE — Telephone Encounter (Signed)
Patient called and states that she is ready to try the cholesterol medication but would like to try atorvastatin. She would like it sent to [facility] on [address]. She is also asking for recommendations for Drs in [place] as she will be moving shortly.

## 2021-04-15 NOTE — Telephone Encounter (Signed)
Medication sent in. 

## 2021-04-15 NOTE — Telephone Encounter (Signed)
Samantha Byrd called back to check on cholesterol medication, I let her know prescription was being sent to pharmacy and that she needs to schedule 3 month follow up with labs prior. She did not want to schedule that right now. She is going to check into getting a doctor where they are living first, she will call back. I have put in a recall for 3 months so we can follow up.

## 2021-04-15 NOTE — Telephone Encounter (Signed)
Left message for patient to return call to office at [phone].  

## 2021-06-30 DIAGNOSIS — L853 Xerosis cutis: Secondary | ICD-10-CM | POA: Diagnosis not present

## 2021-06-30 DIAGNOSIS — Z780 Asymptomatic menopausal state: Secondary | ICD-10-CM | POA: Diagnosis not present

## 2021-06-30 DIAGNOSIS — E782 Mixed hyperlipidemia: Secondary | ICD-10-CM | POA: Diagnosis not present

## 2021-06-30 DIAGNOSIS — R002 Palpitations: Secondary | ICD-10-CM | POA: Diagnosis not present

## 2021-07-02 DIAGNOSIS — E782 Mixed hyperlipidemia: Secondary | ICD-10-CM | POA: Diagnosis not present

## 2021-07-02 DIAGNOSIS — R002 Palpitations: Secondary | ICD-10-CM | POA: Diagnosis not present

## 2021-07-09 DIAGNOSIS — M810 Age-related osteoporosis without current pathological fracture: Secondary | ICD-10-CM | POA: Diagnosis not present

## 2021-07-09 DIAGNOSIS — Z13820 Encounter for screening for osteoporosis: Secondary | ICD-10-CM | POA: Diagnosis not present

## 2021-07-09 DIAGNOSIS — Z780 Asymptomatic menopausal state: Secondary | ICD-10-CM | POA: Diagnosis not present

## 2021-11-13 DIAGNOSIS — S32028A Other fracture of second lumbar vertebra, initial encounter for closed fracture: Secondary | ICD-10-CM | POA: Diagnosis not present

## 2021-11-13 DIAGNOSIS — Z91018 Allergy to other foods: Secondary | ICD-10-CM | POA: Diagnosis not present

## 2021-11-13 DIAGNOSIS — R52 Pain, unspecified: Secondary | ICD-10-CM | POA: Diagnosis not present

## 2021-11-13 DIAGNOSIS — S32020A Wedge compression fracture of second lumbar vertebra, initial encounter for closed fracture: Secondary | ICD-10-CM | POA: Diagnosis not present

## 2021-11-13 DIAGNOSIS — Z888 Allergy status to other drugs, medicaments and biological substances status: Secondary | ICD-10-CM | POA: Diagnosis not present

## 2021-11-13 DIAGNOSIS — M549 Dorsalgia, unspecified: Secondary | ICD-10-CM | POA: Diagnosis not present

## 2021-11-13 DIAGNOSIS — X500XXA Overexertion from strenuous movement or load, initial encounter: Secondary | ICD-10-CM | POA: Diagnosis not present

## 2021-11-13 DIAGNOSIS — Z79899 Other long term (current) drug therapy: Secondary | ICD-10-CM | POA: Diagnosis not present

## 2021-11-13 DIAGNOSIS — E785 Hyperlipidemia, unspecified: Secondary | ICD-10-CM | POA: Diagnosis not present

## 2021-11-13 DIAGNOSIS — M545 Low back pain, unspecified: Secondary | ICD-10-CM | POA: Diagnosis not present

## 2021-11-14 DIAGNOSIS — S32020A Wedge compression fracture of second lumbar vertebra, initial encounter for closed fracture: Secondary | ICD-10-CM | POA: Diagnosis not present

## 2021-11-14 DIAGNOSIS — M545 Low back pain, unspecified: Secondary | ICD-10-CM | POA: Diagnosis not present

## 2021-11-21 DIAGNOSIS — M545 Low back pain, unspecified: Secondary | ICD-10-CM | POA: Diagnosis not present

## 2021-11-21 DIAGNOSIS — S32020A Wedge compression fracture of second lumbar vertebra, initial encounter for closed fracture: Secondary | ICD-10-CM | POA: Diagnosis not present

## 2021-11-28 DIAGNOSIS — M8088XA Other osteoporosis with current pathological fracture, vertebra(e), initial encounter for fracture: Secondary | ICD-10-CM | POA: Diagnosis not present

## 2021-11-28 DIAGNOSIS — S32020A Wedge compression fracture of second lumbar vertebra, initial encounter for closed fracture: Secondary | ICD-10-CM | POA: Diagnosis not present

## 2021-11-28 DIAGNOSIS — M545 Low back pain, unspecified: Secondary | ICD-10-CM | POA: Diagnosis not present

## 2021-12-19 DIAGNOSIS — M5459 Other low back pain: Secondary | ICD-10-CM | POA: Diagnosis not present

## 2021-12-19 DIAGNOSIS — S32020A Wedge compression fracture of second lumbar vertebra, initial encounter for closed fracture: Secondary | ICD-10-CM | POA: Diagnosis not present

## 2021-12-19 DIAGNOSIS — R2689 Other abnormalities of gait and mobility: Secondary | ICD-10-CM | POA: Diagnosis not present

## 2021-12-24 DIAGNOSIS — M5459 Other low back pain: Secondary | ICD-10-CM | POA: Diagnosis not present

## 2021-12-24 DIAGNOSIS — S32020A Wedge compression fracture of second lumbar vertebra, initial encounter for closed fracture: Secondary | ICD-10-CM | POA: Diagnosis not present

## 2021-12-24 DIAGNOSIS — R2689 Other abnormalities of gait and mobility: Secondary | ICD-10-CM | POA: Diagnosis not present

## 2021-12-30 DIAGNOSIS — R2689 Other abnormalities of gait and mobility: Secondary | ICD-10-CM | POA: Diagnosis not present

## 2021-12-30 DIAGNOSIS — S32020A Wedge compression fracture of second lumbar vertebra, initial encounter for closed fracture: Secondary | ICD-10-CM | POA: Diagnosis not present

## 2021-12-30 DIAGNOSIS — G8929 Other chronic pain: Secondary | ICD-10-CM | POA: Diagnosis not present

## 2021-12-30 DIAGNOSIS — S32020D Wedge compression fracture of second lumbar vertebra, subsequent encounter for fracture with routine healing: Secondary | ICD-10-CM | POA: Diagnosis not present

## 2021-12-30 DIAGNOSIS — M25552 Pain in left hip: Secondary | ICD-10-CM | POA: Diagnosis not present

## 2021-12-30 DIAGNOSIS — M5459 Other low back pain: Secondary | ICD-10-CM | POA: Diagnosis not present

## 2021-12-30 DIAGNOSIS — M545 Low back pain, unspecified: Secondary | ICD-10-CM | POA: Diagnosis not present

## 2022-01-01 DIAGNOSIS — S32020A Wedge compression fracture of second lumbar vertebra, initial encounter for closed fracture: Secondary | ICD-10-CM | POA: Diagnosis not present

## 2022-01-01 DIAGNOSIS — R2689 Other abnormalities of gait and mobility: Secondary | ICD-10-CM | POA: Diagnosis not present

## 2022-01-01 DIAGNOSIS — M5459 Other low back pain: Secondary | ICD-10-CM | POA: Diagnosis not present

## 2022-01-09 DIAGNOSIS — S32020A Wedge compression fracture of second lumbar vertebra, initial encounter for closed fracture: Secondary | ICD-10-CM | POA: Diagnosis not present

## 2022-01-09 DIAGNOSIS — R2689 Other abnormalities of gait and mobility: Secondary | ICD-10-CM | POA: Diagnosis not present

## 2022-01-09 DIAGNOSIS — M5459 Other low back pain: Secondary | ICD-10-CM | POA: Diagnosis not present

## 2022-02-02 DIAGNOSIS — E782 Mixed hyperlipidemia: Secondary | ICD-10-CM | POA: Diagnosis not present

## 2022-02-02 DIAGNOSIS — R42 Dizziness and giddiness: Secondary | ICD-10-CM | POA: Diagnosis not present

## 2022-02-02 DIAGNOSIS — M8000XS Age-related osteoporosis with current pathological fracture, unspecified site, sequela: Secondary | ICD-10-CM | POA: Diagnosis not present

## 2022-02-02 DIAGNOSIS — I441 Atrioventricular block, second degree: Secondary | ICD-10-CM | POA: Diagnosis not present

## 2022-02-02 DIAGNOSIS — R001 Bradycardia, unspecified: Secondary | ICD-10-CM | POA: Diagnosis not present

## 2022-02-10 DIAGNOSIS — E559 Vitamin D deficiency, unspecified: Secondary | ICD-10-CM | POA: Diagnosis not present

## 2022-02-13 DIAGNOSIS — M8580 Other specified disorders of bone density and structure, unspecified site: Secondary | ICD-10-CM | POA: Diagnosis not present

## 2022-04-03 DIAGNOSIS — E785 Hyperlipidemia, unspecified: Secondary | ICD-10-CM | POA: Diagnosis not present

## 2022-04-03 DIAGNOSIS — R001 Bradycardia, unspecified: Secondary | ICD-10-CM | POA: Diagnosis not present

## 2022-04-24 DIAGNOSIS — H5213 Myopia, bilateral: Secondary | ICD-10-CM | POA: Diagnosis not present

## 2022-04-24 DIAGNOSIS — H43393 Other vitreous opacities, bilateral: Secondary | ICD-10-CM | POA: Diagnosis not present

## 2022-04-24 DIAGNOSIS — H2513 Age-related nuclear cataract, bilateral: Secondary | ICD-10-CM | POA: Diagnosis not present

## 2022-04-24 DIAGNOSIS — D3132 Benign neoplasm of left choroid: Secondary | ICD-10-CM | POA: Diagnosis not present

## 2022-04-24 DIAGNOSIS — H04123 Dry eye syndrome of bilateral lacrimal glands: Secondary | ICD-10-CM | POA: Diagnosis not present

## 2022-04-24 DIAGNOSIS — H35013 Changes in retinal vascular appearance, bilateral: Secondary | ICD-10-CM | POA: Diagnosis not present

## 2022-04-30 DIAGNOSIS — E559 Vitamin D deficiency, unspecified: Secondary | ICD-10-CM | POA: Diagnosis not present

## 2022-04-30 DIAGNOSIS — M25561 Pain in right knee: Secondary | ICD-10-CM | POA: Diagnosis not present

## 2022-04-30 DIAGNOSIS — R002 Palpitations: Secondary | ICD-10-CM | POA: Diagnosis not present

## 2022-04-30 DIAGNOSIS — E782 Mixed hyperlipidemia: Secondary | ICD-10-CM | POA: Diagnosis not present

## 2022-04-30 DIAGNOSIS — M8000XS Age-related osteoporosis with current pathological fracture, unspecified site, sequela: Secondary | ICD-10-CM | POA: Diagnosis not present

## 2022-04-30 DIAGNOSIS — M25562 Pain in left knee: Secondary | ICD-10-CM | POA: Diagnosis not present

## 2022-04-30 DIAGNOSIS — G8929 Other chronic pain: Secondary | ICD-10-CM | POA: Diagnosis not present

## 2022-05-22 DIAGNOSIS — R001 Bradycardia, unspecified: Secondary | ICD-10-CM | POA: Diagnosis not present

## 2022-08-27 DIAGNOSIS — M8000XS Age-related osteoporosis with current pathological fracture, unspecified site, sequela: Secondary | ICD-10-CM | POA: Diagnosis not present

## 2022-08-27 DIAGNOSIS — E782 Mixed hyperlipidemia: Secondary | ICD-10-CM | POA: Diagnosis not present

## 2022-08-31 DIAGNOSIS — M8580 Other specified disorders of bone density and structure, unspecified site: Secondary | ICD-10-CM | POA: Diagnosis not present

## 2022-08-31 DIAGNOSIS — E782 Mixed hyperlipidemia: Secondary | ICD-10-CM | POA: Diagnosis not present

## 2022-08-31 DIAGNOSIS — D84821 Immunodeficiency due to drugs: Secondary | ICD-10-CM | POA: Diagnosis not present

## 2022-08-31 DIAGNOSIS — Z0000 Encounter for general adult medical examination without abnormal findings: Secondary | ICD-10-CM | POA: Diagnosis not present

## 2022-08-31 DIAGNOSIS — Z1211 Encounter for screening for malignant neoplasm of colon: Secondary | ICD-10-CM | POA: Diagnosis not present

## 2022-08-31 DIAGNOSIS — Z1239 Encounter for other screening for malignant neoplasm of breast: Secondary | ICD-10-CM | POA: Diagnosis not present

## 2022-08-31 DIAGNOSIS — Z79899 Other long term (current) drug therapy: Secondary | ICD-10-CM | POA: Diagnosis not present

## 2023-03-04 DIAGNOSIS — S32020A Wedge compression fracture of second lumbar vertebra, initial encounter for closed fracture: Secondary | ICD-10-CM | POA: Diagnosis not present

## 2023-03-04 DIAGNOSIS — E782 Mixed hyperlipidemia: Secondary | ICD-10-CM | POA: Diagnosis not present

## 2023-03-04 DIAGNOSIS — K5900 Constipation, unspecified: Secondary | ICD-10-CM | POA: Diagnosis not present

## 2023-03-04 DIAGNOSIS — M810 Age-related osteoporosis without current pathological fracture: Secondary | ICD-10-CM | POA: Diagnosis not present

## 2023-03-04 DIAGNOSIS — E162 Hypoglycemia, unspecified: Secondary | ICD-10-CM | POA: Diagnosis not present

## 2023-04-01 DIAGNOSIS — J0100 Acute maxillary sinusitis, unspecified: Secondary | ICD-10-CM | POA: Diagnosis not present
